# Patient Record
Sex: Male | Born: 1965 | Race: White | Marital: Single | Smoking: Never smoker
Health system: Southern US, Community
[De-identification: ages and names within clinical notes are randomized; demographics above are authoritative.]

## PROBLEM LIST (undated history)

## (undated) DIAGNOSIS — I429 Cardiomyopathy, unspecified: Secondary | ICD-10-CM

## (undated) DIAGNOSIS — I209 Angina pectoris, unspecified: Secondary | ICD-10-CM

## (undated) DIAGNOSIS — R0609 Other forms of dyspnea: Secondary | ICD-10-CM

## (undated) DIAGNOSIS — R0600 Dyspnea, unspecified: Secondary | ICD-10-CM

## (undated) DIAGNOSIS — R0602 Shortness of breath: Secondary | ICD-10-CM

## (undated) DIAGNOSIS — E785 Hyperlipidemia, unspecified: Secondary | ICD-10-CM

## (undated) DIAGNOSIS — I10 Essential (primary) hypertension: Secondary | ICD-10-CM

## (undated) HISTORY — DX: Dyspnea, unspecified: R06.00

## (undated) HISTORY — PX: CARDIAC CATHETERIZATION: SHX172

---

## 2008-09-14 HISTORY — PX: BACK SURGERY: SHX140

## 2012-04-05 ENCOUNTER — Other Ambulatory Visit: Payer: Self-pay | Admitting: Family Medicine

## 2012-04-05 DIAGNOSIS — M549 Dorsalgia, unspecified: Secondary | ICD-10-CM

## 2012-04-05 DIAGNOSIS — M542 Cervicalgia: Secondary | ICD-10-CM

## 2012-04-11 ENCOUNTER — Ambulatory Visit: Payer: Commercial Managed Care - POS

## 2014-08-13 ENCOUNTER — Ambulatory Visit: Payer: Commercial Managed Care - POS

## 2014-08-13 NOTE — Pre-Procedure Instructions (Signed)
   Labs Done on 08/08/2014 @ [employer] in [place] [phone]   NPO reinforced and Directions reviewed

## 2014-08-14 ENCOUNTER — Inpatient Hospital Stay: Payer: Commercial Managed Care - POS

## 2014-08-14 ENCOUNTER — Encounter: Admission: RE | Disposition: A | Discharge status: Home Health | Payer: Self-pay | Source: Ambulatory Visit

## 2014-08-14 ENCOUNTER — Ambulatory Visit: Payer: Commercial Managed Care - POS

## 2014-08-14 ENCOUNTER — Inpatient Hospital Stay: Payer: Commercial Managed Care - POS | Admitting: Cardiovascular Disease

## 2014-08-14 ENCOUNTER — Inpatient Hospital Stay
Admission: RE | Admit: 2014-08-14 | Discharge: 2014-08-20 | DRG: 234 | Disposition: A | Discharge status: Home Health | Payer: Commercial Managed Care - POS | Source: Ambulatory Visit

## 2014-08-14 DIAGNOSIS — I10 Essential (primary) hypertension: Secondary | ICD-10-CM | POA: Diagnosis present

## 2014-08-14 DIAGNOSIS — E669 Obesity, unspecified: Secondary | ICD-10-CM | POA: Diagnosis present

## 2014-08-14 DIAGNOSIS — I25119 Atherosclerotic heart disease of native coronary artery with unspecified angina pectoris: Secondary | ICD-10-CM | POA: Diagnosis present

## 2014-08-14 DIAGNOSIS — I25110 Atherosclerotic heart disease of native coronary artery with unstable angina pectoris: Principal | ICD-10-CM | POA: Diagnosis present

## 2014-08-14 DIAGNOSIS — Z006 Encounter for examination for normal comparison and control in clinical research program: Secondary | ICD-10-CM

## 2014-08-14 DIAGNOSIS — E785 Hyperlipidemia, unspecified: Secondary | ICD-10-CM | POA: Diagnosis present

## 2014-08-14 DIAGNOSIS — I25709 Atherosclerosis of coronary artery bypass graft(s), unspecified, with unspecified angina pectoris: Secondary | ICD-10-CM

## 2014-08-14 DIAGNOSIS — Z6829 Body mass index (BMI) 29.0-29.9, adult: Secondary | ICD-10-CM

## 2014-08-14 HISTORY — DX: Angina pectoris, unspecified: I20.9

## 2014-08-14 HISTORY — DX: Other forms of dyspnea: R06.09

## 2014-08-14 HISTORY — DX: Shortness of breath: R06.02

## 2014-08-14 HISTORY — DX: Essential (primary) hypertension: I10

## 2014-08-14 HISTORY — DX: Cardiomyopathy, unspecified: I42.9

## 2014-08-14 HISTORY — DX: Hyperlipidemia, unspecified: E78.5

## 2014-08-14 SURGERY — LEFT HEART CATH POSS PCI
Anesthesia: Conscious Sedation | Laterality: Left

## 2014-08-14 MED ORDER — ASPIRIN 81 MG PO TBEC
81.0000 mg | DELAYED_RELEASE_TABLET | Freq: Every day | ORAL | Status: DC
Start: 2014-08-14 — End: 2014-08-16
  Administered 2014-08-14 – 2014-08-15 (×2): 81 mg via ORAL
  Filled 2014-08-14 (×2): qty 1

## 2014-08-14 MED ORDER — METOPROLOL TARTRATE 25 MG PO TABS
25.0000 mg | ORAL_TABLET | Freq: Two times a day (BID) | ORAL | Status: DC
Start: 2014-08-14 — End: 2014-08-16
  Administered 2014-08-14 – 2014-08-15 (×3): 25 mg via ORAL
  Filled 2014-08-14 (×3): qty 1

## 2014-08-14 MED ORDER — ATORVASTATIN CALCIUM 40 MG PO TABS
40.0000 mg | ORAL_TABLET | Freq: Every evening | ORAL | Status: DC
Start: 2014-08-14 — End: 2014-08-15
  Administered 2014-08-14: 40 mg via ORAL
  Filled 2014-08-14: qty 1

## 2014-08-14 MED ORDER — IODIXANOL 320 MG/ML IV SOLN
110.0000 mL | Freq: Once | INTRAVENOUS | Status: AC | PRN
Start: 2014-08-14 — End: 2014-08-14
  Administered 2014-08-14: 110 mL via INTRA_ARTERIAL

## 2014-08-14 MED ORDER — SODIUM CHLORIDE 0.9 % IV SOLN
INTRAVENOUS | Status: DC
Start: 2014-08-14 — End: 2014-08-14

## 2014-08-14 MED ORDER — MIDAZOLAM HCL 2 MG/2ML IJ SOLN
INTRAMUSCULAR | Status: AC
Start: 2014-08-14 — End: 2014-08-14
  Administered 2014-08-14: 2 mg via INTRAVENOUS
  Filled 2014-08-14: qty 2

## 2014-08-14 MED ORDER — NITROGLYCERIN 2 % TD OINT
0.5000 [in_us] | TOPICAL_OINTMENT | TRANSDERMAL | Status: DC
Start: 2014-08-14 — End: 2014-08-16
  Administered 2014-08-14 – 2014-08-15 (×4): 0.5 [in_us] via TOPICAL
  Filled 2014-08-14 (×4): qty 1

## 2014-08-14 MED ORDER — VERAPAMIL HCL 2.5 MG/ML IV SOLN
INTRAVENOUS | Status: AC
Start: 2014-08-14 — End: 2014-08-14
  Administered 2014-08-14: 2.5 mg via INTRA_ARTERIAL
  Filled 2014-08-14: qty 2

## 2014-08-14 MED ORDER — HEPARIN SODIUM (PORCINE) 1000 UNIT/ML IJ SOLN
INTRAMUSCULAR | Status: AC
Start: 2014-08-14 — End: 2014-08-14
  Administered 2014-08-14: 4000 [IU] via INTRAVENOUS
  Filled 2014-08-14: qty 10

## 2014-08-14 MED ORDER — FENTANYL CITRATE 0.05 MG/ML IJ SOLN
INTRAMUSCULAR | Status: AC
Start: 2014-08-14 — End: 2014-08-14
  Administered 2014-08-14: 50 ug via INTRAVENOUS
  Filled 2014-08-14: qty 2

## 2014-08-14 MED ORDER — ACETAMINOPHEN 325 MG PO TABS
650.0000 mg | ORAL_TABLET | ORAL | Status: DC | PRN
Start: 2014-08-14 — End: 2014-08-16

## 2014-08-14 MED ORDER — SODIUM CHLORIDE 0.9 % IV SOLN
INTRAVENOUS | Status: AC
Start: 2014-08-14 — End: 2014-08-15
  Administered 2014-08-14: 50 mL/h via INTRAVENOUS

## 2014-08-14 MED ORDER — FUROSEMIDE 10 MG/ML IJ SOLN
INTRAMUSCULAR | Status: AC
Start: 2014-08-14 — End: 2014-08-14
  Administered 2014-08-14: 40 mg via INTRAVENOUS
  Filled 2014-08-14: qty 4

## 2014-08-14 MED ORDER — LIDOCAINE HCL (PF) 1 % IJ SOLN
INTRAMUSCULAR | Status: AC
Start: 2014-08-14 — End: 2014-08-14
  Administered 2014-08-14: 2 mL
  Filled 2014-08-14: qty 30

## 2014-08-14 MED ORDER — HEPARIN WASH BOWL 5 UNITS/ML SOLN (CATH LAB)
Status: AC
Start: 2014-08-14 — End: 2014-08-14
  Filled 2014-08-14: qty 2000

## 2014-08-14 NOTE — Progress Notes (Signed)
R radial vasc band deflation completed. R radial vasc band removed. Gauze/tegaderm applied to R radial puncture site. Dressing=clean/dry/intact, soft, no oozing or hematoma, palpable R radial pulse, pt denies CP or SOB, pt denies RUE numbness/coolness/pain/tingling, no s/s of distress.     Pt is A&Ox4, SR on tele, asymptomatic, aldrete=10. Reviewed post-procedure instructions including bleeding precautions w/ pt, pt verb understanding.

## 2014-08-14 NOTE — Progress Notes (Addendum)
ASA      Indication(s):     ( ) Failed stress test  (X) Chest pain / Angina  ( ) Abnormal EKG  ( ) Known CAD  ( ) Heart Failure / post Heart Transplant  ( ) Other:    48 yo M HTN, HLD, progressively worsening CP/SOB concerning for unstable angina with INF wall ischemia and INF wall HK/mildly reduced LVSF on myocardial perfusion imaging, referred for LHC/Cor Angio/Possible PCI.    Review of systems performed:   Yes  (x)         Allergies:     No Known Allergies      Most recent labs:    If available in Epic:     No results found for: WBC, HGB, HCT, MCV, PLT     No results for input(s): NA, K, CL, CO2, BUN, CREAT, EGFR, GLU, CA, ALB, PHOS in the last 168 hours.    If unavailable in Epic, present in paper chart.    ASA Physical Status:     (  )  ASA 1  Healthy patient    (  )  ASA 2  Mild systemic illness    (X)  ASA 3  Systemic disease, though not incapacitating    (  )  ASA 4  Severe systemic disease that is a constant threat to life     (  )  ASA 5  Moribund condition, patient unexpected to live >24 hours, irrespective of    procedure    (  )  E       Emergent procedure    Planned sedation:     (  ) No sedation  (x) Moderate sedation  (  ) Deep sedation (with Anesthesiology present)      Conclusion:     This patient has been seen and examined immediately prior to the procedure, and I feel that they are an appropriate candidate for the planned procedure with the planned sedation.    The procedural details, indications, risks, benefits, and alternatives to the planned procedure and sedation have been explained to the patient or the patient's guardian. All questions answered. Patient desires to proceed.    The currently available history & physical has been reviewed, and there are no major changes.     Exceptions only in the case of emergency.         Burna Forts, MD

## 2014-08-14 NOTE — Progress Notes (Signed)
Pt arrived to [facility] from home. Pt and procedure verified-LHC with possible PCI with dr. Idolina Primer. Assessment completed. No distress noted. Pt denies pain and sob. All needs addressed. NPO status verified. Call bell within reach. Will continue to monitor.     Pre- Cath Teaching and Learning Objectives   Learner: Candie Mile,   Preference for learning: Verbal  Teaching Method: Verbal Instruction   Outcome of Learning: Fully Achieved    Described/Demonstrated the following:     + Responsibilities of patient's care  + Cardiac Cath/PTCA/Stent/Brachytherapy   + Purpose of procedure  + Need to be NPO pre-procedure  + Need for maintaining bedrest & straight leg post-procedure & sheath removal.  + Necessary fluid intake after procedure  + Symptoms of bleeding & states plan to notify nurse.

## 2014-08-14 NOTE — Progress Notes (Signed)
Report was obtained for the [facility] nurse, pt came to the unit at about 1835 via a stretcher escorted by the nurse. Pt walked to the bed with no difficulty. Orientation about the unit and room is given. Dressing on the right wrist clean, dry, and intact. No hematoma noted. Pt is placed on tele monitor and dinner is provided. Cardiac surgery NP is in the room discussing planned procedure at this time.

## 2014-08-14 NOTE — Progress Notes (Signed)
Pt with vascband set at 10ml. Right radial area clean, dry, and intact.

## 2014-08-14 NOTE — Plan of Care (Signed)
Pt. Being taken to ultra sound. Unable to take vitals. Pt. Being escorted with nurse.

## 2014-08-14 NOTE — Progress Notes (Signed)
Post-Cardiac Catheterization Note    Patient Info:   Jimmy Cardenas     48 y.o. male   1.676 m (5\' 6" ) 85.004 kg (187 lb 6.4 oz)         48 yo M HTN, HLD, progressively worsening CP/SOB concerning for unstable angina with INF wall ischemia and INF wall HK/mildly reduced LVSF on myocardial perfusion imaging, referred for LHC/Cor Angio/Possible PCI.    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 Multivessel CAD.  2. Left main disease (90% distal LM).  3. Subtotal occlusion of dominant mid-RCA.  4. Mildly reduced LVSF/EF 45-50% with INF>ANT HK.    Findings:     Right radial artery access.    Hemodynamics: LV 158/0/39. AO 141/87/114.    LV Function: EF 45-50%. INF severe HK. ANT mild HK.    Cor Angio:    Right dominant circulation.    Left Main: Distal 90% stenosis.    LAD: Mild diffuse dz.    Circumflex: Non-dominant. Mild diffuse dz.    RCA: Dominant. Mid-vessel 99% subtotal occlusion. Distal vessel ? moderate disease (underfilled due to more proximal disease).    Right radial artery sheath removed and Vasc Band applied with patent hemostasis.      Complications:   None    PLAN:    - CVTSA consult for CABG this admission ([facility] contacted by me).  - Vasc Band off 2 hours.  - Admit to hospital.  - ASA, Statin, B-Blocker, NTG paste.  - Echo, Carotid Duplex.  - Plan discussed with patient.    Signed by: Burna Forts, MD   08/14/2014  2:03 PM

## 2014-08-14 NOTE — Consults (Addendum)
FINAL                    [facility]                            SURGERY CONSULTATION     Patient Name:           Jimmy Cardenas, Jimmy Cardenas Date:             08/14/2014              Age      48  [place] MRN:              [id number]               Gender:  Male  [place] Account #:        [id number]            Race:    Caucasian  Date of Birth:          02-Jan-1966  Scheduled Surgeon:      Darrelle Barrell,M.D.  Consult Performed  By:  Cherre Huger, NP  Hospital:               [facility]  Assessment Location:    Patient  Room     Referring Physician(s):  Truesdell,Alexander,M.D.     History of Present  Illness:  CARDIAC SURGERY  EVALUATION IS REQUESTED  FOR THIS 48  YEAR OLD MALE  WITH PROGRESSIVELY  WORSENING DYSPNEA  ON EXERTION AND  CHEST  DISCOMFORT. MYOCARDIAL  PERFUSION  STUDY DONE BY HIS  PCP SHOWED  INFERIOR WALL ISCHEMIA  AND MILDLY  REDUCED SYSTOLIC  FUNCTION AND HE  WAS SENT FOR CARDIOLOGY  EVALUATION.   CARDIAC CATH  WAS DONE ON  08/14/14 BY DR. Idolina Primer  AND REVEALED  90% DISTAL LEFT  MAIN DISEASE  AND SUBTOTAL MID  RCA OCCLUSION WITH  AND EF OF 45-  50%.  HE IS  REFERRED FOR CABG.        Past Medical History     Hypertension  Hypercholesterolemia  Alcohol Consumption:  <= 1 drink/week  Exercise: None  Smoking History:       Never        Past Surgical        L4-5 laminectomy  2010  History:  Family History:      NO PREMATURE  CAD                       MOTHER WITH HEART  PROBLEMS OLDER  AGE  Social History:      LIVES ALONE.  Radiology and Test   CARDIAC CATH  08/14/14:   LEFT MAIN-  90% DISTAL  Review:              OCCLUSION,  MID  RCA- SUBTOTAL  OCCLUSION,  EF 45-                       50%,                          ECHO 08/14/14:   RESULTS PENDING                       STS RISK SCORE:   MORTALITY = 0.28%,   M/M= 6.13%\  LABS (08/07/14):  K+ 4.6,  BUN  21, CREAT 1.08,                       PLT 281,  HGB  13.3, HCT 40.2,   WBC 8.4         ALLERGY                  DESCRIPTION  ALLERGIES NOT ON  FILE  NO KNOWN ALLERGIES       SYSTEMIC        Home Medications:     MEDICATION     DOSE   UNIT     ROUTE      FREQUENCY    COMMENT  Aspirin        81     mg       PO         Daily  Lipitor        40     mg       PO         Daily  Metoprolol     25     mg       PO         Q 12  HRS  NITRO-BID OINT0.5     2%       TOP        Q 6 HRS                 inch                 Physical Examination     HR:     80   BP            BP     148/8RR:  18   O2     99 % Temp: 98.7               Right:        Left:  9              Sat:  Weight: 187.33   84.96  kgHeight:  65.75  167.01  cm          lbs                in        Review of Systems:     Respiratory  +DOE. Denies  wheezing, cough,  hemoptysis, asthma  or               emphysema.  CV           +CHEST PAIN  WITH EXERTION. Denies  heart murmur,               palpitations,  orthopnea, paroxysmal  nocturnal  dyspnea or               edema.  Abdomen      Denies indigestion,  nausea, vomiting,  abdominal  pain,               dark or bloody  stools, any change  in bowel habits,               jaundice or  hepatitis.  Extremities  Denies muscle  weakness, arthralgias,  arthritis  or gout.  NeurologicalDenies  Hx of  seizures, strokes,  TIA's, migraines,               weakness, syncope  or near syncope.  Physical Exam  General:        Cooperative,  alert  and oriented, well-developed,                  well-nourished,  in  no acute distress.  Skin:           Warm  and dry, no rashes  or lesions.  Heart:          S1  S2, Regular rhythm,  No murmurs, Rubs  or Gallops.  Lungs:          Lungs  clear to auscultation,  no wheezes,  rales or                  rhonchi.  Neck:           Jugular  venous pressure  less than 5  cm  Abdomen:        Bowel  sounds present  in all four quadrants.  Soft and                  non-tender.  No hepatosplenomegaly.  Extremities:    No  clubbing, cyanosis  or edema noted.     Diagnosis:        CAD, LM  DISEASE  HTN  HLD  HX L4-5 LAMINECTOMY     Treatment Plan:   FOR CABG 08/16/14  WITH DR. Talon Witting  BILATERAL CAROTID  DOPPLER TO RULE  OUT STENOSIS.  BILATERAL LOWER  EXTREMITY DOPPLERS  FOR VEIN MAPPING.  BEDSIDE SPIROMETRY  TESTING IS NEEDED  Patient was educated  on procedure,  risks and benefits.  Risks include  but not limited  to infection, bleeding,  MI, CVA, renal  failure,  death. The patient  was given consent  information handouts,  including  FAQs about surgical  site infections.  All questions  were answered.  The patient agrees  to proceed with  surgery.  MRSA SCREENING PER  PROTOCOL.  PRE OPERATIVE AMIODARONE  PRESCRIBED.  PRE OPERATIVE BACTROBAN  PRESCRIBED.  WILL DECREASE STATINS  PER PROTOCOL.              Suezanne Jacquet Loman,  NP   electronically  signed on 08/14/2014  7:15:15 PM  with status of Approved     I have reviewed  the H and P and examined  the patient  and any changes  to the patient from  the time of the  original H and  P are included in  this document.     Windell Moment, M.D.     electronically signed  on 08/16/2014  1:53:45 PM with  status of Final

## 2014-08-14 NOTE — Progress Notes (Signed)
Bedside Spirometry is done. The result was faxed to Medical Record and the hard copy is in the chart.     Rolla Flatten RRT       08/14/14 2200   Spirometry Pre and/or Post   Procedure Location and Type Bedside- Pre Only   Pre FVC 3.55 L   Pre FEV1 3.03 L   Pre FEV1/FVC % (Calculated) 85   Patient follows commands Yes   Cough during exhalation No   Early termination No   Leak No   Variable effort Yes   2 FVC 150 ml of each other Yes   2 FEV1 150 ml of each other Yes   3 maneuvers performed Yes   Patient Effort Good   Adverse Reactions None   Primary Charges   $ Spirometry Type Performed Simple - CPT 94010

## 2014-08-14 NOTE — Plan of Care (Signed)
Pt s/p heart cath today. Right radial dressing CDI, no oozing, no hematoma. VS wnl, tele shows SR. Pt denies any chest pain or SOB. For CABG on thur.

## 2014-08-14 NOTE — Progress Notes (Signed)
CV surgery consult requested for Jimmy Cardenas, a 48 year old male with exertional chest pain and + nuclear stress test who came into hospital today for cardiac cath and found to have 90% distal LM disease and occluded RCA. He is being referred for CABG. Full consult to follow.  Films reviewed by Dr. Koleen Nimrod.  Will plan for CABG 08/16/14 with Dr. Ad.  Cardiac surgery orders entered into EPIC .    Jimmy Herter, NP  Cardiac surgery [facility]  Spect [phone]

## 2014-08-14 NOTE — Progress Notes (Signed)
Pt transported safely via stretcher on monitor w/ RN escort form ultrasound to [id number]. R radial vasc band deflation completed. R radial vasc band removed. Gauze/tegaderm applied to R radial puncture site. Dressing=clean/dry/intact, soft, no oozing or hematoma, palpable R radial pulse, pt denies CP or SOB, pt denies RUE numbness/coolness/pain/tingling, no s/s of distress.

## 2014-08-15 LAB — ECG 12-LEAD
Atrial Rate: 76 {beats}/min
P Axis: 33 degrees
P-R Interval: 144 ms
Q-T Interval: 380 ms
QRS Duration: 84 ms
QTC Calculation (Bezet): 427 ms
R Axis: 24 degrees
T Axis: 5 degrees
Ventricular Rate: 76 {beats}/min

## 2014-08-15 LAB — URINALYSIS WITH MICROSCOPIC
Bilirubin, UA: NEGATIVE
Glucose, UA: NEGATIVE
Ketones UA: NEGATIVE
Leukocyte Esterase, UA: NEGATIVE
Nitrite, UA: NEGATIVE
Specific Gravity UA: 1.034 (ref 1.001–1.035)
Urine pH: 6 (ref 5.0–8.0)
Urobilinogen, UA: NORMAL mg/dL

## 2014-08-15 LAB — CBC
Hematocrit: 45.1 % (ref 42.0–52.0)
Hgb: 14.9 g/dL (ref 13.0–17.0)
MCH: 29.3 pg (ref 28.0–32.0)
MCHC: 33 g/dL (ref 32.0–36.0)
MCV: 88.8 fL (ref 80.0–100.0)
MPV: 10.3 fL (ref 9.4–12.3)
Nucleated RBC: 0 /100 WBC (ref 0–1)
Platelets: 283 10*3/uL (ref 140–400)
RBC: 5.08 10*6/uL (ref 4.70–6.00)
RDW: 13 % (ref 12–15)
WBC: 8.92 10*3/uL (ref 3.50–10.80)

## 2014-08-15 LAB — COMPREHENSIVE METABOLIC PANEL
ALT: 23 U/L (ref 0–55)
AST (SGOT): 16 U/L (ref 5–34)
Albumin/Globulin Ratio: 1 (ref 0.9–2.2)
Albumin: 3.8 g/dL (ref 3.5–5.0)
Alkaline Phosphatase: 81 U/L (ref 38–106)
BUN: 19 mg/dL (ref 9.0–28.0)
Bilirubin, Total: 0.4 mg/dL (ref 0.2–1.2)
CO2: 24 mEq/L (ref 22–29)
Calcium: 9.3 mg/dL (ref 8.5–10.5)
Chloride: 104 mEq/L (ref 100–111)
Creatinine: 1 mg/dL (ref 0.7–1.3)
Globulin: 3.8 g/dL — ABNORMAL HIGH (ref 2.0–3.6)
Glucose: 102 mg/dL — ABNORMAL HIGH (ref 70–100)
Potassium: 3.7 mEq/L (ref 3.5–5.1)
Protein, Total: 7.6 g/dL (ref 6.0–8.3)
Sodium: 137 mEq/L (ref 136–145)

## 2014-08-15 LAB — TYPE AND SCREEN
AB Screen Gel: NEGATIVE
ABO Rh: A POS

## 2014-08-15 LAB — PT AND APTT
PT INR: 1 (ref 0.9–1.1)
PT: 13.1 s (ref 12.6–15.0)
PTT: 33 s (ref 23–37)

## 2014-08-15 LAB — GFR: EGFR: >60

## 2014-08-15 LAB — HEMOGLOBIN A1C: Hemoglobin A1C: 5.3 % (ref 0.0–6.0)

## 2014-08-15 MED ORDER — ATORVASTATIN CALCIUM 80 MG PO TABS
80.0000 mg | ORAL_TABLET | Freq: Every evening | ORAL | Status: DC
Start: 2014-08-15 — End: 2014-08-16
  Administered 2014-08-15: 80 mg via ORAL
  Filled 2014-08-15: qty 1

## 2014-08-15 MED ORDER — CEFUROXIME SODIUM 1.5 G IJ/IV SOLR (WRAP)
1.5000 g | Status: DC
Start: 2014-08-16 — End: 2014-08-16

## 2014-08-15 MED ORDER — MUPIROCIN 2% NASAL OINTMENT
TOPICAL_OINTMENT | Freq: Two times a day (BID) | CUTANEOUS | Status: AC
Start: 2014-08-15 — End: 2014-08-16
  Administered 2014-08-15 – 2014-08-16 (×2): 1 via NASAL
  Filled 2014-08-15 (×2): qty 1

## 2014-08-15 MED ORDER — AMIODARONE HCL 200 MG PO TABS
400.0000 mg | ORAL_TABLET | Freq: Three times a day (TID) | ORAL | Status: DC
Start: 2014-08-15 — End: 2014-08-16
  Administered 2014-08-15 – 2014-08-16 (×4): 400 mg via ORAL
  Filled 2014-08-15 (×4): qty 2

## 2014-08-15 NOTE — Progress Notes (Signed)
VSS, patient is anticipating CABG tomorrow, type and screen and other required labs drawn and done.  He had a shower this morning but will need a CHG bath this evening and tomorrow morning. NPO at midnight and beyond. Patient will be picked up around 5:30 am in morning for his surgery.

## 2014-08-15 NOTE — Progress Notes (Addendum)
HEART PROGRESS NOTE  [facility]      Date Time: 08/15/2014 8:29 AM  Patient Name: [facility]  Medical Record #:  [id number]  Account#:  [id number]  Admission Date:  08/14/2014         Patient Active Problem List   Diagnosis   . 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    Assessment:    S/p LHC 12/1 showing multivessel CAD (Left main 90%, RCA 99%) and EF 45-50%   Inferior wall ischemia on perfusion imaging   HLD   HTN    Recommendations:    Undergoing workup for CABG, tentatively planned for 12/3   Continue asa, statin, BB, nitro-bid    ATTENDING ADDENDUM  Patient seen and examined.  No cp, R radial site intact, likely cabg in am.  On Nitrobid comorbid HTN/LV dysfunction, eventual ACE likely post CABG, held preop.    Johnell Comings MD [facility]    Medications:      Scheduled Meds:      amiodarone 400 mg Oral Q8H SCH   aspirin EC 81 mg Oral Daily   atorvastatin 40 mg Oral QHS   [START ON 08/16/2014] cefuroxime 1.5 g Intravenous 60 Min Pre-Op   metoprolol 25 mg Oral Q12H SCH   mupirocin  Nasal Q12H SCH   nitroglycerin 0.5 inch Topical Q6H HOLD MN       Continuous Infusions:            Physical Exam:       VITAL SIGNS PHYSICAL EXAM   Filed Vitals:    08/15/14 0827   BP: 136/79   Pulse: 89   Temp: 96.1 F (35.6 C)   Resp: 16   SpO2: 97%     Temp (24hrs), Avg:97.4 F (36.3 C), Min:96.1 F (35.6 C), Max:98.7 F (37.1 C)      Telemetry: Reviewed no changes    No intake or output data in the 24 hours ending 08/15/14 0829 Physical Exam  General: awake, alert, breathing comfortably, no acute distress  Head: normocephalic  Eyes: EOM's intact  Cardiovascular: regular rate and rhythm, normal S1, S2, no S3, no S4, no murmurs, rubs or gallops  Neck: no carotid bruits or JVD  Lungs: clear to auscultation bilateraly, without wheezing,  rhonchi, or rales  Abdomen: soft, non-tender, non-distended; no palpable masses,  normoactive bowel sounds  Extremities: no edema  Pulse: equal pulses, 4/4 symmetric  Neurological: Alert and oriented X3, mood and affect normal  Musculoskeletal: normal strength and tone         Labs:   No results for input(s): CK, TROPI, TROPT, CKMBINDEX in the last 168 hours.  No results for input(s): DIG in the last 168 hours.  No results for input(s): CHOL, TRIG, HDL, LDL in the last 168 hours.    Recent Labs  Lab 08/15/14  0104   BILIRUBIN, TOTAL 0.4   PROTEIN, TOTAL 7.6   ALBUMIN 3.8   ALT 23   AST (SGOT) 16     No results for input(s): MG in the last 168 hours.    Recent Labs  Lab 08/15/14  0104   PT 13.1   PT INR 1.0   PTT 33       Recent Labs  Lab 08/15/14  0104   WBC 8.92  HEMOGLOBIN 14.9   HEMATOCRIT 45.1   PLATELETS 283       Recent Labs  Lab 08/15/14  0104   SODIUM 137   POTASSIUM 3.7   CHLORIDE 104   CO2 24   BUN 19.0   CREATININE 1.0   EGFR >60.0   GLUCOSE 102*   CALCIUM 9.3     No results for input(s): TSH, FREET3 in the last 168 hours.    Invalid input(s): FREET4    .No results found for: BNP     Imaging:   Radiological Procedure reviewed.        Signed by: Dreama Saa, [place]      [facility]  NP Spectralink [phone] (8am-5pm)  MD Spectralink [phone] or [phone] (8am-5pm)  Arrhythmia Spectralink [phone] (8am-4:30pm)  After hours, non urgent consult line [phone]  After Hours, urgent consults [phone]

## 2014-08-15 NOTE — Progress Notes (Signed)
CV surgery NP - pt for CABG tomorrow - case has been moved to first case - RN advised and will tell patient. Pre-op orders placed.

## 2014-08-15 NOTE — Progress Notes (Signed)
Case Management Initial Discharge Planning Assessment    [place] Dispo: Home no needs  Anticipated [place] Date: TBD-pending CABG         Patient is a Medicare focused diagnosis patient? Yes/No NO   Patient is a 30 day INPATIENT readmission (current inpatient admission and another inpatient admission within 30 days)? Yes/No NO      EPIC Documentation  CM Assessment complete button selected in CM Navigator? Yes/No Yes   Readmissions flowsheet completed in CM Navigator if patient is readmission? Yes/No Not yet       Psychosocial/Demographic Information   Name of interviewee/s:  Jimmy Cardenas   Orientation and decision making abilities of patient (ie a&ox3 able to make decisions, demented patient, patient on vent, etc)    Alert & Oriented X3   Does the patient have an Advance Directive? Location? (home/on chart, if home-advised to bring in copy?) <no information>  Advance Directive: Patient has advance directive, copy not in chart]   Healthcare Decision Maker (HDM) (if other than the patient) Include relationship and contact information.   His sister Jimmy Cardenas is an absolute [phone]   Any additional emergency contacts? Extended Emergency Contact Information  Primary Emergency Contact: Contact,No   [place]  Home Phone: [phone]  Relation: None  Secondary Emergency Contact: Jimmy Cardenas [place]  Home Phone: [phone]  Work Phone: [phone]  Relation: Other   Pt lives with:  Living Arrangements: Family members]   Type of residence where patient lives:    ]   ]By himself in a [facility] in a basement    Prior level of functioning (ambulation & ADL's)   ]Very independent   Support system-list  (i.e.church, friends, extended family, friends?)  Family   Do you want to designate an individual who will care for or assist you upon discharge? Sister Jimmy Cardenas   If yes: Please list the name, relationship, phone number, and address of the designated individual.  Name:  Relationship:  Phone Number:  Address:    **Please find info above   Correct Insurance listed on face sheet - verified with the patient/HDM  Yes--[employer]      Discharge Planning Services in Place  Name of Primary Care Physician verified in patient banner (update in patient banner if not listed) Jimmy Sale, DO  [phone]   What DME does the patient currently own? (rolling walker, hospital bed, home O2, BiPAP/CPAP, bedside commode, cane, hoyer lift)  ]   ]None   ]   Are PT/OT services indicated? If so, has it been ordered?   No   Has the patient been to an Acute Rehab or SNF in the past?  If so, where?    No   Does the patient currently have home health or hospice/palliative services in place?  If so, list agency name.    No   Does the patient already have community dialysis set up?  If so, where?  No      Readmission Assessment  What is the current LACE score?  1   Is this patient an inpatient to inpatient 30 day readmission?  No   Previous admission discharge diagnosis  N/A   Was patient readmitted from a facility?  N/A      Patient active with Home Health?  N/A      Patient active with Home Hospice?    N/A   Contributing factors to readmission (i.e., no follow up appt on previous d/c, unable to  get meds, no insurance, no social support, etc.)    Did patient/family understand what medication was for and how to administer, symptoms to indicate worsening condition, activity and diet restrictions at time of previous d/c?        N/A      Anticipated Discharge Plan  Anticipated Disposition: Option A  Home no needs   Anticipated Disposition: Option B  Family support   Who will transport the patient upon discharge?  Jimmy Cardenas   If applicable, were SNF or Hospice choices provided?  N/A   Palliative Care Consult needed? (if yes, contact attending MD)  N/A      Medicare/Medicare HMO Patients Only  If this patient is inpatient, was an initial IMM signed within 24 hours of admission? (Look in media tab,  documents table or shadow chart)    N/A   Is this patient identified as a Medicare focused diagnosis or readmission? Yes/No NO        Shirlean Mylar, MSW  Case Management  CTUS  [facility]  [phone]

## 2014-08-15 NOTE — Research Enrollment (Signed)
Research Enrollment Note:   Research Study: The Use of [place] Cardioplegia in Adult Cardiac Surgery   Subject was identified by Dr. Ad and meets all inclusion and no exclusion criteria for the above study.   The above study was discussed by Ronnell Freshwater RN with patient on 08/15/2014 for approximately 30 minutes, in a private setting, room 363.   Subject was provided an opportunity to ask questions, and given adequate time to read through the informed consent document. All questions were answered by Ronnell Freshwater RN. Subject verbalizes understanding of study randomization, risks and potential benefits, and the voluntary nature of participation. Subject agrees to participate in study, and subject signed informed consent document.   No research procedures performed prior to obtaining informed consent. Copy of signed consent given to subject, copy of consent scanned and placed in electronic chart in EPIC under media tab, and original consent form resides in subject research file. Site study contact information was given to subject.   For questions regarding the study, please contact PI Dr. Alvira Philips Ad (pager # [phone]) or Ronnell Freshwater RN, [facility] (pager [phone]).

## 2014-08-16 ENCOUNTER — Encounter
Admission: RE | Disposition: A | Discharge status: Home Health | Payer: Commercial Managed Care - POS | Source: Ambulatory Visit

## 2014-08-16 ENCOUNTER — Other Ambulatory Visit: Payer: Commercial Managed Care - POS

## 2014-08-16 ENCOUNTER — Inpatient Hospital Stay: Payer: Commercial Managed Care - POS

## 2014-08-16 DIAGNOSIS — I25110 Atherosclerotic heart disease of native coronary artery with unstable angina pectoris: Principal | ICD-10-CM

## 2014-08-16 DIAGNOSIS — I10 Essential (primary) hypertension: Secondary | ICD-10-CM

## 2014-08-16 DIAGNOSIS — E785 Hyperlipidemia, unspecified: Secondary | ICD-10-CM

## 2014-08-16 DIAGNOSIS — Z006 Encounter for examination for normal comparison and control in clinical research program: Secondary | ICD-10-CM

## 2014-08-16 DIAGNOSIS — E669 Obesity, unspecified: Secondary | ICD-10-CM

## 2014-08-16 DIAGNOSIS — Z6829 Body mass index (BMI) 29.0-29.9, adult: Secondary | ICD-10-CM

## 2014-08-16 HISTORY — PX: CORONARY ARTERY BYPASS GRAFT: SHX141

## 2014-08-16 HISTORY — PX: TEE: SHX5571

## 2014-08-16 HISTORY — PX: ENDOSCOPIC,VEIN HARVEST: SHX3864

## 2014-08-16 HISTORY — PX: CORONARY ARTERY BYPASS: SHX3487

## 2014-08-16 LAB — ABG WITH NA/K/CA IONIZED
Arterial Total CO2: 24.7 mEq/L (ref 24.0–30.0)
Arterial Total CO2: 23.5 mEq/L — ABNORMAL LOW (ref 24.0–30.0)
Arterial Total CO2: 23.8 mEq/L — ABNORMAL LOW (ref 24.0–30.0)
Arterial Total CO2: 23.8 mEq/L — ABNORMAL LOW (ref 24.0–30.0)
Arterial Total CO2: 24.6 mEq/L (ref 24.0–30.0)
Arterial Total CO2: 22 mEq/L — ABNORMAL LOW (ref 24.0–30.0)
Base Excess, Arterial: -0.3 mEq/L (ref <=2.0)
Base Excess, Arterial: -2.4 mEq/L — ABNORMAL LOW (ref <=2.0)
Base Excess, Arterial: -1.9 mEq/L (ref <=2.0)
Base Excess, Arterial: -0.9 mEq/L (ref <=2.0)
Base Excess, Arterial: -1.5 mEq/L (ref <=2.0)
Base Excess, Arterial: -3 mEq/L — ABNORMAL LOW (ref <=2.0)
Calcium, Ionized: 2.39 mEq/L (ref 2.30–2.58)
Calcium, Ionized: 2.32 mEq/L (ref 2.30–2.58)
Calcium, Ionized: 2.05 mEq/L — ABNORMAL LOW (ref 2.30–2.58)
Calcium, Ionized: 2.07 mEq/L — ABNORMAL LOW (ref 2.30–2.58)
Calcium, Ionized: 2.12 mEq/L — ABNORMAL LOW (ref 2.30–2.58)
Calcium, Ionized: 2.41 mEq/L (ref 2.30–2.58)
HCO3, Arterial: 23.5 mEq/L (ref 23.0–29.0)
HCO3, Arterial: 22.3 mEq/L — ABNORMAL LOW (ref 23.0–29.0)
HCO3, Arterial: 22.6 mEq/L — ABNORMAL LOW (ref 23.0–29.0)
HCO3, Arterial: 22.6 mEq/L — ABNORMAL LOW (ref 23.0–29.0)
HCO3, Arterial: 23.4 mEq/L (ref 23.0–29.0)
HCO3, Arterial: 20.9 mEq/L — ABNORMAL LOW (ref 23.0–29.0)
O2 Sat, Arterial: >100 % (ref 95.0–100.0)
O2 Sat, Arterial: 99.9 % (ref 95.0–100.0)
O2 Sat, Arterial: >100 % (ref 95.0–100.0)
O2 Sat, Arterial: >100 % (ref 95.0–100.0)
O2 Sat, Arterial: >100 % (ref 95.0–100.0)
O2 Sat, Arterial: 99.9 % (ref 95.0–100.0)
Temperature: 37
Temperature: 34.3
Temperature: 37
Temperature: 37
Temperature: 37
Temperature: 35.3
Whole Blood Potassium: 3.6 mEq/L (ref 3.5–5.3)
Whole Blood Potassium: 4.1 mEq/L (ref 3.5–5.3)
Whole Blood Potassium: 4.4 mEq/L (ref 3.5–5.3)
Whole Blood Potassium: 4.3 mEq/L (ref 3.5–5.3)
Whole Blood Potassium: 4.4 mEq/L (ref 3.5–5.3)
Whole Blood Potassium: 3.7 mEq/L (ref 3.5–5.3)
Whole Blood Sodium: 139 mEq/L (ref 136–146)
Whole Blood Sodium: 139 mEq/L (ref 136–146)
Whole Blood Sodium: 135 mEq/L — ABNORMAL LOW (ref 136–146)
Whole Blood Sodium: 135 mEq/L — ABNORMAL LOW (ref 136–146)
Whole Blood Sodium: 136 mEq/L (ref 136–146)
Whole Blood Sodium: 138 mEq/L (ref 136–146)
pCO2, Arterial: 37.7 mmhg (ref 35.0–45.0)
pCO2, Arterial: 35.6 mmhg (ref 35.0–45.0)
pCO2, Arterial: 40.1 mmhg (ref 35.0–45.0)
pCO2, Arterial: 38 mmhg (ref 35.0–45.0)
pCO2, Arterial: 39.6 mmhg (ref 35.0–45.0)
pCO2, Arterial: 32.2 mmhg — ABNORMAL LOW (ref 35.0–45.0)
pH, Arterial: 7.412 (ref 7.350–7.450)
pH, Arterial: 7.397 (ref 7.350–7.450)
pH, Arterial: 7.369 (ref 7.350–7.450)
pH, Arterial: 7.389 (ref 7.350–7.450)
pH, Arterial: 7.392 (ref 7.350–7.450)
pH, Arterial: 7.418 (ref 7.350–7.450)
pO2, Arterial: 375 mmhg — ABNORMAL HIGH (ref 80.0–90.0)
pO2, Arterial: 449 mmhg — ABNORMAL HIGH (ref 80.0–90.0)
pO2, Arterial: 273 mmhg — ABNORMAL HIGH (ref 80.0–90.0)
pO2, Arterial: 225 mmhg — ABNORMAL HIGH (ref 80.0–90.0)
pO2, Arterial: 261 mmhg — ABNORMAL HIGH (ref 80.0–90.0)
pO2, Arterial: 202 mmhg — ABNORMAL HIGH (ref 80.0–90.0)

## 2014-08-16 LAB — COOXIMETRY PROFILE
Carboxyhemoglobin: 1.6 % (ref 0.0–3.0)
Carboxyhemoglobin: 1.3 % (ref 0.0–3.0)
Carboxyhemoglobin: 1.8 % (ref 0.0–3.0)
Hematocrit Total Calculated: 24.5 % — ABNORMAL LOW (ref 40.0–54.0)
Hematocrit Total Calculated: 25.4 % — ABNORMAL LOW (ref 40.0–54.0)
Hematocrit Total Calculated: 26.1 % — ABNORMAL LOW (ref 40.0–54.0)
Hemoglobin Total: 7.9 g/dL — ABNORMAL LOW (ref 13.0–17.0)
Hemoglobin Total: 8.2 g/dL — ABNORMAL LOW (ref 13.0–17.0)
Hemoglobin Total: 8.4 g/dL — ABNORMAL LOW (ref 13.0–17.0)
Methemoglobin: 0.9 % (ref 0.0–3.0)
Methemoglobin: 0.6 % (ref 0.0–3.0)
Methemoglobin: 1.2 % (ref 0.0–3.0)
O2 Content: 8.9
O2 Content: 8.2
O2 Content: 8.9
Oxygenated Hemoglobin: 80.4 % — ABNORMAL LOW (ref 85.0–98.0)
Oxygenated Hemoglobin: 68.8 % — ABNORMAL LOW (ref 85.0–98.0)
Oxygenated Hemoglobin: 77.7 % — ABNORMAL LOW (ref 85.0–98.0)

## 2014-08-16 LAB — CBC
Hematocrit: 35.3 % — ABNORMAL LOW (ref 42.0–52.0)
Hgb: 11.7 g/dL — ABNORMAL LOW (ref 13.0–17.0)
MCH: 29.3 pg (ref 28.0–32.0)
MCHC: 33.1 g/dL (ref 32.0–36.0)
MCV: 88.3 fL (ref 80.0–100.0)
MPV: 9.9 fL (ref 9.4–12.3)
Nucleated RBC: 0 /100 WBC (ref 0–1)
Platelets: 156 10*3/uL (ref 140–400)
RBC: 4 10*6/uL — ABNORMAL LOW (ref 4.70–6.00)
RDW: 13 % (ref 12–15)
WBC: 11.67 10*3/uL — ABNORMAL HIGH (ref 3.50–10.80)

## 2014-08-16 LAB — MAGNESIUM
Magnesium: 3 mg/dL — ABNORMAL HIGH (ref 1.6–2.6)
Magnesium: 2.7 mg/dL — ABNORMAL HIGH (ref 1.6–2.6)

## 2014-08-16 LAB — GLUCOSE WHOLE BLOOD - POCT
Whole Blood Glucose POCT: 127 mg/dL — ABNORMAL HIGH (ref 70–100)
Whole Blood Glucose POCT: 116 mg/dL — ABNORMAL HIGH (ref 70–100)
Whole Blood Glucose POCT: 128 mg/dL — ABNORMAL HIGH (ref 70–100)
Whole Blood Glucose POCT: 137 mg/dL — ABNORMAL HIGH (ref 70–100)
Whole Blood Glucose POCT: 118 mg/dL — ABNORMAL HIGH (ref 70–100)
Whole Blood Glucose POCT: 106 mg/dL — ABNORMAL HIGH (ref 70–100)
Whole Blood Glucose POCT: 114 mg/dL — ABNORMAL HIGH (ref 70–100)
Whole Blood Glucose POCT: 109 mg/dL — ABNORMAL HIGH (ref 70–100)
Whole Blood Glucose POCT: 112 mg/dL — ABNORMAL HIGH (ref 70–100)

## 2014-08-16 LAB — BLOOD GAS, ARTERIAL
Arterial Total CO2: 24.5 mEq/L (ref 24.0–30.0)
Arterial Total CO2: 23.5 mEq/L — ABNORMAL LOW (ref 24.0–30.0)
Base Excess, Arterial: -0.9 mEq/L (ref <=2.0)
Base Excess, Arterial: -1.5 mEq/L (ref <=2.0)
FIO2: 100 %
FIO2: 40 %
HCO3, Arterial: 23.3 mEq/L (ref 23.0–29.0)
HCO3, Arterial: 22.4 mEq/L — ABNORMAL LOW (ref 23.0–29.0)
O2 Sat, Arterial: 100 % (ref 95.0–100.0)
O2 Sat, Arterial: 100 % (ref 95.0–100.0)
PEEP: 5
Rate: 10
Temperature: 34.6
Temperature: 35.8
Tidal vol.: 600
pCO2, Arterial: 34.9 mmhg — ABNORMAL LOW (ref 35.0–45.0)
pCO2, Arterial: 34.7 mmhg — ABNORMAL LOW (ref 35.0–45.0)
pH, Arterial: 7.427 (ref 7.350–7.450)
pH, Arterial: 7.419 (ref 7.350–7.450)
pO2, Arterial: 291 mmhg — ABNORMAL HIGH (ref 80.0–90.0)
pO2, Arterial: 182 mmhg — ABNORMAL HIGH (ref 80.0–90.0)

## 2014-08-16 LAB — BLOOD GAS, VENOUS
Base Excess, Ven: -1.8 mEq/L
Base Excess, Ven: -0.8 mEq/L
Base Excess, Ven: -1.4 mEq/L
HCO3, Ven: 23.2 mEq/L
HCO3, Ven: 23.5 mEq/L
HCO3, Ven: 24.3 mEq/L
O2 Sat, Venous: 82.5 %
O2 Sat, Venous: 70.1 %
O2 Sat, Venous: 80.1 %
Temperature: 37
Temperature: 37
Temperature: 37
Venous Total CO2: 24.6 mEq/L
Venous Total CO2: 24.9 mEq/L
Venous Total CO2: 25.7 mEq/L
pCO2, Venous: 44.2 mmhg
pCO2, Venous: 43.4 mmhg
pCO2, Venous: 45.3 mmhg
pH, Ven: 7.341
pH, Ven: 7.349
pH, Ven: 7.354
pO2, Venous: 43.9 mmhg
pO2, Venous: 39.5 mmhg
pO2, Venous: 41.7 mmhg

## 2014-08-16 LAB — TOTAL HEMOGLOBIN GROUP
Hematocrit Total Calculated: 42.5 % (ref 40.0–54.0)
Hematocrit Total Calculated: 35.3 % — ABNORMAL LOW (ref 40.0–54.0)
Hematocrit Total Calculated: 23.6 % — ABNORMAL LOW (ref 40.0–54.0)
Hematocrit Total Calculated: 24.9 % — ABNORMAL LOW (ref 40.0–54.0)
Hematocrit Total Calculated: 25.4 % — ABNORMAL LOW (ref 40.0–54.0)
Hematocrit Total Calculated: 25.6 % — ABNORMAL LOW (ref 40.0–54.0)
Hemoglobin Total: 13.8 g/dL (ref 13.0–17.0)
Hemoglobin Total: 11.5 g/dL — ABNORMAL LOW (ref 13.0–17.0)
Hemoglobin Total: 7.6 g/dL — ABNORMAL LOW (ref 13.0–17.0)
Hemoglobin Total: 8 g/dL — ABNORMAL LOW (ref 13.0–17.0)
Hemoglobin Total: 8.2 g/dL — ABNORMAL LOW (ref 13.0–17.0)
Hemoglobin Total: 8.2 g/dL — ABNORMAL LOW (ref 13.0–17.0)

## 2014-08-16 LAB — RED BLOOD CELLS OR HOLD
Expiration Date: 201601012359
Expiration Date: 201601012359
UTYPE: A POS
UTYPE: A POS

## 2014-08-16 LAB — HEPARIN ASSAY
Heparin Assay Test Concentration: 2.7 mg/kg
Heparin Assay Test Concentration: >3.5 mg/kg
Heparin Assay Test Concentration: 3 mg/kg
Heparin Assay Test Concentration: 3.5 mg/kg
Heparin Assay Test Concentration: 2.5 mg/kg
Heparin Assay Test Concentration: 3 mg/kg
Heparin Assay Test Concentration: 0 mg/kg
Total Protamine Dose: 0 mg
Total Protamine Dose: 0 mg
Total Protamine Dose: 0 mg
Total Protamine Dose: 0 mg
Total Protamine Dose: 0 mg
Total Protamine Dose: 350 mg
Total Protamine Dose: 0 mg
Total Units of Heparin Required: 30000
Total Units of Heparin Required: 0
Total Units of Heparin Required: 0
Total Units of Heparin Required: 0
Total Units of Heparin Required: 0
Total Units of Heparin Required: 5000
Total Units of Heparin Required: 0

## 2014-08-16 LAB — THROMBOELASTOGRAPH CLOTTING PROFILE
TEG Angle: 69.7 (ref 55.2–78.4)
TEG CI: 1.4 (ref <=3.0)
TEG K Time: 1.4 (ref 0.8–2.8)
TEG MA: 73.6 mm — ABNORMAL HIGH (ref 50.6–69.4)
TEG R Time: 7 (ref 2.5–7.5)

## 2014-08-16 LAB — GLUCOSE WHOLE BLOOD
Whole Blood Glucose: 112 mg/dL — ABNORMAL HIGH (ref 70–100)
Whole Blood Glucose: 152 mg/dL — ABNORMAL HIGH (ref 70–100)
Whole Blood Glucose: 135 mg/dL — ABNORMAL HIGH (ref 70–100)
Whole Blood Glucose: 156 mg/dL — ABNORMAL HIGH (ref 70–100)
Whole Blood Glucose: 156 mg/dL — ABNORMAL HIGH (ref 70–100)
Whole Blood Glucose: 133 mg/dL — ABNORMAL HIGH (ref 70–100)

## 2014-08-16 LAB — PT AND APTT
PT INR: 1.3 — ABNORMAL HIGH (ref 0.9–1.1)
PT: 16 s — ABNORMAL HIGH (ref 12.6–15.0)
PTT: 33 s (ref 23–37)

## 2014-08-16 LAB — BASIC METABOLIC PANEL
BUN: 16 mg/dL (ref 9.0–28.0)
CO2: 23 mEq/L (ref 22–29)
Calcium: 8.2 mg/dL — ABNORMAL LOW (ref 8.5–10.5)
Chloride: 110 mEq/L (ref 100–111)
Creatinine: 0.9 mg/dL (ref 0.7–1.3)
Glucose: 122 mg/dL — ABNORMAL HIGH (ref 70–100)
Potassium: 4.6 mEq/L (ref 3.5–5.1)
Sodium: 138 mEq/L (ref 136–145)

## 2014-08-16 LAB — ACTIVATED CLOTTING TIME
ACT POCT: 162 — ABNORMAL HIGH (ref 85–120)
ACT POCT: 665 — ABNORMAL HIGH (ref 85–120)
ACT POCT: 580 — ABNORMAL HIGH (ref 85–120)
ACT POCT: 670 — ABNORMAL HIGH (ref 85–120)
ACT POCT: 509 — ABNORMAL HIGH (ref 85–120)
ACT POCT: 574 — ABNORMAL HIGH (ref 85–120)
ACT POCT: 135 — ABNORMAL HIGH (ref 85–120)

## 2014-08-16 LAB — POTASSIUM
Potassium: 3.7 mEq/L (ref 3.5–5.1)
Potassium: 4.7 mEq/L (ref 3.5–5.1)

## 2014-08-16 LAB — TROPONIN I
Troponin I: <0.01 ng/mL (ref 0.00–0.09)
Troponin I: 0.79 ng/mL — ABNORMAL HIGH (ref 0.00–0.09)

## 2014-08-16 LAB — GFR: EGFR: >60

## 2014-08-16 LAB — COLD AGGLUTININ SCREEN: Cold Screen: NEGATIVE

## 2014-08-16 SURGERY — CORONARY ARTERY BYPASS
Anesthesia: Anesthesia General | Site: Leg Upper | Wound class: Clean

## 2014-08-16 MED ORDER — HYDROMORPHONE HCL 1 MG/ML IJ SOLN
INTRAMUSCULAR | Status: AC
Start: 2014-08-16 — End: 2014-08-16
  Administered 2014-08-16: 0.5 mg via INTRAVENOUS
  Filled 2014-08-16: qty 1

## 2014-08-16 MED ORDER — ESMOLOL HCL 10 MG/ML IV SOLN
INTRAVENOUS | Status: AC
Start: 2014-08-16 — End: ?
  Filled 2014-08-16: qty 10

## 2014-08-16 MED ORDER — DIPHENHYDRAMINE HCL 50 MG/ML IJ SOLN
INTRAMUSCULAR | Status: DC | PRN
Start: 2014-08-16 — End: 2014-08-16
  Administered 2014-08-16: 50 mg via INTRAVENOUS

## 2014-08-16 MED ORDER — FAMOTIDINE 20 MG PO TABS
20.00 mg | ORAL_TABLET | Freq: Two times a day (BID) | ORAL | Status: DC
Start: 2014-08-16 — End: 2014-08-20
  Administered 2014-08-16 – 2014-08-20 (×9): 20 mg via ORAL
  Filled 2014-08-16 (×10): qty 1

## 2014-08-16 MED ORDER — NITROPRUSSIDE SODIUM 25 MG/ML IV SOLN
0.3000 ug/kg/min | INTRAVENOUS | Status: DC
Start: 2014-08-16 — End: 2014-08-17

## 2014-08-16 MED ORDER — PROPOFOL INFUSION 10 MG/ML
INTRAVENOUS | Status: DC | PRN
Start: 2014-08-16 — End: 2014-08-16
  Administered 2014-08-16: 50 ug/kg/min via INTRAVENOUS

## 2014-08-16 MED ORDER — MAGNESIUM SULFATE 50 % IJ SOLN
INTRAMUSCULAR | Status: AC
Start: 2014-08-16 — End: ?
  Filled 2014-08-16: qty 10

## 2014-08-16 MED ORDER — SODIUM CHLORIDE 0.9 % IV SOLN
INTRAVENOUS | Status: DC | PRN
Start: 2014-08-16 — End: 2014-08-16

## 2014-08-16 MED ORDER — PROPOFOL INFUSION 10 MG/ML
0.00 ug/kg/min | INTRAVENOUS | Status: DC
Start: 2014-08-16 — End: 2014-08-16
  Administered 2014-08-16: 50 ug/kg/min via INTRAVENOUS

## 2014-08-16 MED ORDER — ASPIRIN 325 MG PO TABS
325.00 mg | ORAL_TABLET | Freq: Every day | ORAL | Status: DC
Start: 2014-08-16 — End: 2014-08-20
  Administered 2014-08-16 – 2014-08-20 (×5): 325 mg via ORAL
  Filled 2014-08-16 (×5): qty 1

## 2014-08-16 MED ORDER — DIPHENHYDRAMINE HCL 50 MG/ML IJ SOLN
INTRAMUSCULAR | Status: AC
Start: 2014-08-16 — End: ?
  Filled 2014-08-16: qty 1

## 2014-08-16 MED ORDER — ROCURONIUM BROMIDE 50 MG/5ML IV SOLN
INTRAVENOUS | Status: DC | PRN
Start: 2014-08-16 — End: 2014-08-16
  Administered 2014-08-16: 50 mg via INTRAVENOUS
  Administered 2014-08-16: 30 mg via INTRAVENOUS
  Administered 2014-08-16: 50 mg via INTRAVENOUS
  Administered 2014-08-16: 20 mg via INTRAVENOUS

## 2014-08-16 MED ORDER — HYDROMORPHONE HCL 1 MG/ML IJ SOLN
0.2500 mg | INTRAMUSCULAR | Status: DC | PRN
Start: 2014-08-16 — End: 2014-08-16
  Administered 2014-08-16: 0.25 mg via INTRAVENOUS
  Filled 2014-08-16: qty 1

## 2014-08-16 MED ORDER — PROTAMINE SULFATE 10 MG/ML IV SOLN
INTRAVENOUS | Status: AC
Start: 2014-08-16 — End: ?
  Filled 2014-08-16: qty 25

## 2014-08-16 MED ORDER — SODIUM PHOSPHATE 3 MMOLE/ML IV SOLN
15.0000 mmol | INTRAVENOUS | Status: DC | PRN
Start: 2014-08-16 — End: 2014-08-16

## 2014-08-16 MED ORDER — NITROGLYCERIN IN D5W 200-5 MCG/ML-% IV SOLN
INTRAVENOUS | Status: DC | PRN
Start: 2014-08-16 — End: 2014-08-16
  Administered 2014-08-16: 100 ug via INTRAVENOUS

## 2014-08-16 MED ORDER — SUFENTANIL CITRATE 100 MCG/2ML IV SOLN
INTRAVENOUS | Status: DC | PRN
Start: 2014-08-16 — End: 2014-08-16
  Administered 2014-08-16: 50 ug via INTRAVENOUS
  Administered 2014-08-16: 25 ug via INTRAVENOUS
  Administered 2014-08-16 (×2): 50 ug via INTRAVENOUS
  Administered 2014-08-16: 25 ug via INTRAVENOUS

## 2014-08-16 MED ORDER — ALBUMIN HUMAN 5 % IV SOLN
INTRAVENOUS | Status: DC | PRN
Start: 2014-08-16 — End: 2014-08-16

## 2014-08-16 MED ORDER — MUPIROCIN 2% NASAL OINTMENT
TOPICAL_OINTMENT | Freq: Two times a day (BID) | CUTANEOUS | Status: DC
Start: 2014-08-16 — End: 2014-08-16

## 2014-08-16 MED ORDER — NITROGLYCERIN 5 MG/ML IV SOLN
INTRAVENOUS | Status: DC | PRN
Start: 2014-08-16 — End: 2014-08-16
  Administered 2014-08-16: 25 ug/min via INTRAVENOUS

## 2014-08-16 MED ORDER — ONDANSETRON 4 MG PO TBDP
4.00 mg | ORAL_TABLET | Freq: Three times a day (TID) | ORAL | Status: DC | PRN
Start: 2014-08-16 — End: 2014-08-20

## 2014-08-16 MED ORDER — DEXTROSE 50 % IV SOLN
50.00 mL | INTRAVENOUS | Status: DC | PRN
Start: 2014-08-16 — End: 2014-08-17

## 2014-08-16 MED ORDER — HYDROMORPHONE HCL 1 MG/ML IJ SOLN
0.5000 mg | INTRAMUSCULAR | Status: DC | PRN
Start: 2014-08-16 — End: 2014-08-17
  Administered 2014-08-16: 0.5 mg via INTRAVENOUS
  Filled 2014-08-16: qty 1

## 2014-08-16 MED ORDER — CALCIUM GLUCONATE 10 % IV SOLN
1.0000 g | INTRAVENOUS | Status: DC | PRN
Start: 2014-08-16 — End: 2014-08-17

## 2014-08-16 MED ORDER — SUFENTANIL CITRATE 250 MCG/5ML IV SOLN
INTRAVENOUS | Status: AC
Start: 2014-08-16 — End: ?
  Filled 2014-08-16: qty 5

## 2014-08-16 MED ORDER — LIDOCAINE HCL (PF) 2 % IJ SOLN
INTRAMUSCULAR | Status: AC
Start: 2014-08-16 — End: ?
  Filled 2014-08-16: qty 5

## 2014-08-16 MED ORDER — CHLORHEXIDINE GLUCONATE 0.12 % MT SOLN
15.0000 mL | Freq: Two times a day (BID) | OROMUCOSAL | Status: DC
Start: 2014-08-16 — End: 2014-08-17
  Administered 2014-08-16 (×2): 15 mL via OROMUCOSAL
  Filled 2014-08-16 (×4): qty 15

## 2014-08-16 MED ORDER — MIDAZOLAM HCL 2 MG/2ML IJ SOLN
INTRAMUSCULAR | Status: AC
Start: 2014-08-16 — End: ?
  Filled 2014-08-16: qty 4

## 2014-08-16 MED ORDER — AMIODARONE HCL 200 MG PO TABS
400.0000 mg | ORAL_TABLET | Freq: Three times a day (TID) | ORAL | Status: DC
Start: 2014-08-16 — End: 2014-08-16

## 2014-08-16 MED ORDER — TRAMADOL HCL 50 MG PO TABS
50.00 mg | ORAL_TABLET | ORAL | Status: DC | PRN
Start: 2014-08-16 — End: 2014-08-20
  Administered 2014-08-17 – 2014-08-19 (×4): 50 mg via ORAL
  Filled 2014-08-16 (×4): qty 1

## 2014-08-16 MED ORDER — MIDAZOLAM HCL 2 MG/2ML IJ SOLN
INTRAMUSCULAR | Status: AC
Start: 2014-08-16 — End: ?
  Filled 2014-08-16: qty 2

## 2014-08-16 MED ORDER — PROTAMINE SULFATE 10 MG/ML IV SOLN
INTRAVENOUS | Status: AC
Start: 2014-08-16 — End: ?
  Filled 2014-08-16: qty 5

## 2014-08-16 MED ORDER — SODIUM PHOSPHATE 3 MMOLE/ML IV SOLN
25.0000 mmol | INTRAVENOUS | Status: DC | PRN
Start: 2014-08-16 — End: 2014-08-16

## 2014-08-16 MED ORDER — PROPRANOLOL HCL 10 MG PO TABS
10.0000 mg | ORAL_TABLET | Freq: Four times a day (QID) | ORAL | Status: DC
Start: 2014-08-16 — End: 2014-08-16
  Administered 2014-08-16: 10 mg via ORAL
  Filled 2014-08-16: qty 1

## 2014-08-16 MED ORDER — AMIODARONE HCL 200 MG PO TABS
200.00 mg | ORAL_TABLET | Freq: Two times a day (BID) | ORAL | Status: DC
Start: 2014-08-16 — End: 2014-08-20
  Administered 2014-08-16 – 2014-08-20 (×9): 200 mg via ORAL
  Filled 2014-08-16 (×9): qty 1

## 2014-08-16 MED ORDER — NEOSTIGMINE METHYLSULFATE 1 MG/ML IJ SOLN
0.0500 mg/kg | Freq: Once | INTRAMUSCULAR | Status: AC
Start: 2014-08-16 — End: 2014-08-16
  Administered 2014-08-16: 4.21 mg via INTRAVENOUS
  Filled 2014-08-16: qty 10

## 2014-08-16 MED ORDER — SENNOSIDES-DOCUSATE SODIUM 8.6-50 MG PO TABS
1.00 | ORAL_TABLET | Freq: Every evening | ORAL | Status: DC
Start: 2014-08-16 — End: 2014-08-20
  Administered 2014-08-16 – 2014-08-19 (×4): 1 via ORAL
  Filled 2014-08-16 (×4): qty 1

## 2014-08-16 MED ORDER — ESMOLOL HCL 10 MG/ML IV SOLN
INTRAVENOUS | Status: DC | PRN
Start: 2014-08-16 — End: 2014-08-16
  Administered 2014-08-16: 20 mg via INTRAVENOUS

## 2014-08-16 MED ORDER — ONDANSETRON HCL 4 MG/2ML IJ SOLN
4.00 mg | Freq: Three times a day (TID) | INTRAMUSCULAR | Status: DC | PRN
Start: 2014-08-16 — End: 2014-08-20
  Administered 2014-08-16: 4 mg via INTRAVENOUS
  Filled 2014-08-16: qty 2

## 2014-08-16 MED ORDER — AMINOCAPROIC ACID 250 MG/ML IV SOLN
20.0000 g | INTRAVENOUS | Status: DC | PRN
Start: 2014-08-16 — End: 2014-08-16
  Administered 2014-08-16: 2 g/h via INTRAVENOUS

## 2014-08-16 MED ORDER — POTASSIUM CHLORIDE 20 MEQ/50ML IV SOLN
INTRAVENOUS | Status: DC | PRN
Start: 2014-08-16 — End: 2014-08-16
  Administered 2014-08-16: 20 meq via INTRAVENOUS

## 2014-08-16 MED ORDER — GLYCOPYRROLATE 0.2 MG/ML IJ SOLN
0.0100 mg/kg | Freq: Once | INTRAMUSCULAR | Status: AC
Start: 2014-08-16 — End: 2014-08-16
  Administered 2014-08-16: 0.842 mg via INTRAVENOUS
  Filled 2014-08-16: qty 5

## 2014-08-16 MED ORDER — LIDOCAINE HCL 2 % IJ SOLN
INTRAMUSCULAR | Status: DC | PRN
Start: 2014-08-16 — End: 2014-08-16
  Administered 2014-08-16: 100 mg

## 2014-08-16 MED ORDER — CEFUROXIME SODIUM 1.5 G IJ/IV SOLR (WRAP)
1.50 g | Freq: Three times a day (TID) | Status: AC
Start: 2014-08-16 — End: 2014-08-17
  Administered 2014-08-16 – 2014-08-17 (×3): 1.5 g via INTRAVENOUS
  Filled 2014-08-16 (×3): qty 1500

## 2014-08-16 MED ORDER — PROPOFOL 10 MG/ML IV EMUL
INTRAVENOUS | Status: AC
Start: 2014-08-16 — End: ?
  Filled 2014-08-16: qty 20

## 2014-08-16 MED ORDER — EPHEDRINE SULFATE 50 MG/ML IJ SOLN
INTRAMUSCULAR | Status: AC
Start: 2014-08-16 — End: ?
  Filled 2014-08-16: qty 1

## 2014-08-16 MED ORDER — BISACODYL 10 MG RE SUPP
10.00 mg | Freq: Every day | RECTAL | Status: DC | PRN
Start: 2014-08-16 — End: 2014-08-20

## 2014-08-16 MED ORDER — VEIN SOLUTION (OR ONLY) (FX)
Status: DC | PRN
Start: 2014-08-16 — End: 2014-08-16
  Administered 2014-08-16: 1

## 2014-08-16 MED ORDER — INSULIN REGULAR HUMAN 100 UNIT/ML IJ SOLN
100.0000 [IU] | INTRAMUSCULAR | Status: DC | PRN
Start: 2014-08-16 — End: 2014-08-16
  Administered 2014-08-16: 2 [IU]/h via INTRAVENOUS

## 2014-08-16 MED ORDER — LUBRIFRESH P.M. OP OINT
TOPICAL_OINTMENT | OPHTHALMIC | Status: AC
Start: 2014-08-16 — End: ?
  Filled 2014-08-16: qty 3.5

## 2014-08-16 MED ORDER — SODIUM PHOSPHATE 3 MMOLE/ML IV SOLN
35.0000 mmol | INTRAVENOUS | Status: DC | PRN
Start: 2014-08-16 — End: 2014-08-16

## 2014-08-16 MED ORDER — PROTAMINE SULFATE 10 MG/ML IV SOLN
INTRAVENOUS | Status: DC | PRN
Start: 2014-08-16 — End: 2014-08-16
  Administered 2014-08-16: 350 mg via INTRAVENOUS

## 2014-08-16 MED ORDER — POTASSIUM CHLORIDE 20 MEQ/50ML IV SOLN
INTRAVENOUS | Status: AC
Start: 2014-08-16 — End: ?
  Filled 2014-08-16: qty 50

## 2014-08-16 MED ORDER — HEPARIN SODIUM (PORCINE) 1000 UNIT/ML IJ SOLN
INTRAMUSCULAR | Status: DC | PRN
Start: 2014-08-16 — End: 2014-08-16
  Administered 2014-08-16: 30000 [IU] via INTRAVENOUS

## 2014-08-16 MED ORDER — PHENYLEPHRINE 100 MCG/ML IV BOLUS (ANESTHESIA)
PREFILLED_SYRINGE | INTRAVENOUS | Status: AC
Start: 2014-08-16 — End: ?
  Filled 2014-08-16: qty 10

## 2014-08-16 MED ORDER — PHENYLEPHRINE 100 MCG/ML IV BOLUS (ANESTHESIA)
PREFILLED_SYRINGE | INTRAVENOUS | Status: DC | PRN
Start: 2014-08-16 — End: 2014-08-16
  Administered 2014-08-16: 50 ug via INTRAVENOUS
  Administered 2014-08-16: 200 ug via INTRAVENOUS
  Administered 2014-08-16 (×2): 100 ug via INTRAVENOUS
  Administered 2014-08-16: 50 ug via INTRAVENOUS
  Administered 2014-08-16 (×2): 100 ug via INTRAVENOUS
  Administered 2014-08-16: 50 ug via INTRAVENOUS
  Administered 2014-08-16 (×5): 100 ug via INTRAVENOUS

## 2014-08-16 MED ORDER — PROPOFOL 10 MG/ML IV EMUL
INTRAVENOUS | Status: AC
Start: 2014-08-16 — End: ?
  Filled 2014-08-16: qty 50

## 2014-08-16 MED ORDER — ROCURONIUM BROMIDE 50 MG/5ML IV SOLN
INTRAVENOUS | Status: AC
Start: 2014-08-16 — End: ?
  Filled 2014-08-16: qty 10

## 2014-08-16 MED ORDER — HYDRALAZINE HCL 20 MG/ML IJ SOLN
10.0000 mg | INTRAMUSCULAR | Status: DC | PRN
Start: 2014-08-16 — End: 2014-08-17

## 2014-08-16 MED ORDER — MUPIROCIN 2% NASAL OINTMENT
TOPICAL_OINTMENT | Freq: Two times a day (BID) | CUTANEOUS | Status: DC
Start: 2014-08-16 — End: 2014-08-20
  Administered 2014-08-16 – 2014-08-20 (×8): 1 via NASAL
  Filled 2014-08-16 (×9): qty 1

## 2014-08-16 MED ORDER — MAGNESIUM SULFATE 50 % IJ SOLN
INTRAMUSCULAR | Status: DC | PRN
Start: 2014-08-16 — End: 2014-08-16
  Administered 2014-08-16: 2 g via INTRAVENOUS

## 2014-08-16 MED ORDER — LACTATED RINGERS IV SOLN
INTRAVENOUS | Status: DC | PRN
Start: 2014-08-16 — End: 2014-08-16

## 2014-08-16 MED ORDER — DEXTROSE 5 % IV SOLN
10.0000 g | INTRAVENOUS | Status: DC | PRN
Start: 2014-08-16 — End: 2014-08-16
  Administered 2014-08-16: 10 g via INTRAVENOUS

## 2014-08-16 MED ORDER — MAGNESIUM SULFATE IN D5W 10-5 MG/ML-% IV SOLN
1.0000 g | INTRAVENOUS | Status: DC | PRN
Start: 2014-08-16 — End: 2014-08-17

## 2014-08-16 MED ORDER — LACTATED RINGERS IV BOLUS
250.0000 mL | INTRAVENOUS | Status: DC | PRN
Start: 2014-08-16 — End: 2014-08-17
  Administered 2014-08-16 (×2): 500 mL via INTRAVENOUS

## 2014-08-16 MED ORDER — SODIUM CHLORIDE 0.9 % IV SOLN
INTRAVENOUS | Status: DC
Start: 2014-08-16 — End: 2014-08-20

## 2014-08-16 MED ORDER — NICARDIPINE IV BOLUS SYRINGE (ANESTHESIA)
INTRAVENOUS | Status: AC
Start: 2014-08-16 — End: ?
  Filled 2014-08-16: qty 5

## 2014-08-16 MED ORDER — EPHEDRINE SULFATE 50 MG/ML IJ SOLN
INTRAMUSCULAR | Status: DC | PRN
Start: 2014-08-16 — End: 2014-08-16
  Administered 2014-08-16 (×2): 5 mg via INTRAVENOUS

## 2014-08-16 MED ORDER — ACETAMINOPHEN 325 MG PO TABS
650.00 mg | ORAL_TABLET | ORAL | Status: DC | PRN
Start: 2014-08-16 — End: 2014-08-20
  Administered 2014-08-17: 650 mg via ORAL
  Filled 2014-08-16: qty 2

## 2014-08-16 MED ORDER — SODIUM CHLORIDE 0.9 % IV SOLN
0.50 [IU]/h | INTRAVENOUS | Status: DC
Start: 2014-08-16 — End: 2014-08-17
  Filled 2014-08-16: qty 1

## 2014-08-16 MED ORDER — HYDROMORPHONE HCL 1 MG/ML IJ SOLN
0.50 mg | Freq: Once | INTRAMUSCULAR | Status: AC
Start: 2014-08-16 — End: 2014-08-16

## 2014-08-16 MED ORDER — POTASSIUM CHLORIDE 20 MEQ/50ML IV SOLN
20.00 meq | INTRAVENOUS | Status: DC | PRN
Start: 2014-08-16 — End: 2014-08-17
  Administered 2014-08-16: 20 meq via INTRAVENOUS
  Filled 2014-08-16: qty 50

## 2014-08-16 MED ORDER — MIDAZOLAM HCL 2 MG/2ML IJ SOLN
INTRAMUSCULAR | Status: DC | PRN
Start: 2014-08-16 — End: 2014-08-16
  Administered 2014-08-16: 2 mg via INTRAVENOUS
  Administered 2014-08-16 (×2): 1 mg via INTRAVENOUS
  Administered 2014-08-16: 2 mg via INTRAVENOUS

## 2014-08-16 MED ORDER — PROPOFOL INFUSION 10 MG/ML
INTRAVENOUS | Status: DC | PRN
Start: 2014-08-16 — End: 2014-08-16
  Administered 2014-08-16: 70 mg via INTRAVENOUS

## 2014-08-16 MED ORDER — OXYCODONE-ACETAMINOPHEN 5-325 MG PO TABS
1.00 | ORAL_TABLET | ORAL | Status: DC | PRN
Start: 2014-08-16 — End: 2014-08-20
  Administered 2014-08-17 – 2014-08-20 (×9): 1 via ORAL
  Filled 2014-08-16 (×9): qty 1

## 2014-08-16 MED ORDER — SODIUM CHLORIDE 0.9 % IV MBP
1.5000 g | INTRAVENOUS | Status: DC
Start: 2014-08-16 — End: 2014-08-16
  Administered 2014-08-16: 1.5 g via INTRAVENOUS

## 2014-08-16 MED ORDER — CHLORHEXIDINE GLUCONATE 0.12 % MT SOLN
15.0000 mL | Freq: Once | OROMUCOSAL | Status: DC
Start: 2014-08-16 — End: 2014-08-16

## 2014-08-16 MED ORDER — POLYETHYLENE GLYCOL 3350 17 G PO PACK
17.0000 g | PACK | Freq: Every day | ORAL | Status: DC
Start: 2014-08-17 — End: 2014-08-20
  Administered 2014-08-17 – 2014-08-19 (×3): 17 g via ORAL
  Filled 2014-08-16 (×3): qty 1

## 2014-08-16 MED ORDER — NITROGLYCERIN IN D5W 200-5 MCG/ML-% IV SOLN
10.0000 ug/min | INTRAVENOUS | Status: DC
Start: 2014-08-16 — End: 2014-08-17
  Administered 2014-08-16: 10 ug/min via INTRAVENOUS

## 2014-08-16 MED ORDER — CHLORHEXIDINE GLUCONATE 0.12 % MT SOLN
OROMUCOSAL | Status: AC
Start: 2014-08-16 — End: 2014-08-16
  Administered 2014-08-16: 30 mL via OROMUCOSAL
  Filled 2014-08-16: qty 15

## 2014-08-16 MED ORDER — SODIUM CHLORIDE 0.9 % IR SOLN
Status: DC | PRN
Start: 2014-08-16 — End: 2014-08-16
  Administered 2014-08-16: 4000 mL

## 2014-08-16 MED ORDER — SODIUM CHLORIDE 0.9 % IJ SOLN
INTRAMUSCULAR | Status: AC
Start: 2014-08-16 — End: ?
  Filled 2014-08-16: qty 10

## 2014-08-16 MED ORDER — INSULIN REGULAR HUMAN 100 UNIT/ML IJ SOLN
100.0000 [IU] | INTRAMUSCULAR | Status: DC | PRN
Start: 2014-08-16 — End: 2014-08-16
  Administered 2014-08-16: 3 [IU] via INTRAVENOUS
  Administered 2014-08-16: 2 [IU] via INTRAVENOUS

## 2014-08-16 MED FILL — Heparin Sodium (Porcine) Inj 1000 Unit/ML: INTRAMUSCULAR | Qty: 50 | Status: AC

## 2014-08-16 MED FILL — Phenylephrine HCl Inj 10 MG/ML: INTRAMUSCULAR | Qty: 1 | Status: AC

## 2014-08-16 MED FILL — Electrolyte-A Solution: Qty: 200 | Status: AC

## 2014-08-16 MED FILL — Sodium Bicarbonate IV Soln 8.4%: INTRAVENOUS | Qty: 50 | Status: AC

## 2014-08-16 MED FILL — Mannitol IV Soln 25%: INTRAVENOUS | Qty: 100 | Status: AC

## 2014-08-16 MED FILL — Water For Inject, Bacteriostatic Benzyl Alcohol: INTRAMUSCULAR | Qty: 30 | Status: AC

## 2014-08-16 MED FILL — Cardioplegic Soln w/ Lidocaine: Qty: 2105.6 | Status: AC

## 2014-08-16 SURGICAL SUPPLY — 157 items
ADAPTER CORONARY CARDIOPLEGIA (Adapter) ×5 IMPLANT
ADHESIVE SKIN SURGISEAL .35ML (Suture) ×5 IMPLANT
APPLCATOR CHLORAPREP 26ML (Prep) ×45 IMPLANT
BAG DECANTER STERILE (Procedure Accessories) ×5 IMPLANT
BANDAGE COBAN STERILE 6IN LF (Bandage) ×1
BLADE BEAVER 6900 MINI BLUE (Blade) ×5 IMPLANT
BLADE COATED 2.75 HEX (Blade) ×3
BLADE MICRO SHARP 15 DEG 3MM (Blade) ×10 IMPLANT
BLADE MINI ES SHP (Blade) ×5 IMPLANT
BLADE S/SU RIBBACK CARB STL 11 (Blade) ×10 IMPLANT
BLADE SURG 10 (Blade) ×10 IMPLANT
CABLE MYO/LEAD BLUE (Cable) ×1
CABLE MYO/LEAD WHITE (Cable) ×1
CABLE PATIENT MYO/LEAD L1.8 MR BLUE (Cable) ×3 IMPLANT
CABLE PATIENT MYO/LEAD L1.8 MR WHITE (Cable) ×3 IMPLANT
CABLE PT MYOLD 1.8MR DISP BLU (Cable) ×1
CABLE PT MYOLD 1.8MR DISP WHT (Cable) ×1
CANNULA PERFUSION VENOUS BLUNT TIP (Cannula) ×6
CANNULA PERFUSION VENOUS VEIN IRRIGATION BLUNT TIP IRRIGATION (Cannula) ×6 IMPLANT
CANNULA PRFSN LF NS BLNT TIP IRR VN (Cannula) ×2
CANNULA RETROPLECIA COR SINUSM (Cannula) ×5 IMPLANT
CANNULA VEIN IRRIG BVL TIP (Cannula) ×2
CATH THORACIC RT ANG STRL 28F (Drain) ×5 IMPLANT
CLAMP SURGICAL INSERT SOFT/TRA (Clips) ×10 IMPLANT
CLIP INTERNAL MEDIUM CHEVRON 6 CARTRIDGE (Clips) ×6
CLIP INTERNAL MEDIUM CHEVRON 6 CARTRIDGE LIGATE TRIANGULATE CROSS (Clips) ×6 IMPLANT
CLIP INTERNAL SMALL CHEVRON 24 CARTRIDGE (Clips) ×3
CLIP INTERNAL SMALL CHEVRON 24 CARTRIDGE LIGATE TRIANGULATE CROSS (Clips) ×3 IMPLANT
CLIP INTNL TI MED CHEVRON WECK HRZN 6 (Clips) ×2
CLIP INTNL TI SM CHEVRON WECK HRZN 24 (Clips) ×1
CLIP LIGATING TITANIUM SMALL (Clips) ×1
CLIP SPRING AVD DBL TRAC 6MM (Clips) ×5 IMPLANT
CLIP SURGICAL SPRING SOFT LIGH (Clips) ×15 IMPLANT
CLIP TITANIUM LIGATING MED (Clips) ×2
CONNECTOR PERFUSION 3/8IN Y TMP STERILE (Connector) ×6 IMPLANT
CONNECTOR PRFSN Y TMP 3/8IN STRL (Connector) ×8
CONNECTOR Y 3/8X3/8X3/8IN (Connector) ×2
DRAIN CHEST ADULT DRY SUCTION (Drain) ×2
DRAIN CHEST THORACIC TUBE DRY SUCTION (Drain) ×6
DRAIN CHEST THORACIC TUBE DRY SUCTION COLLECTION CHAMBER ATRIUM OASIS (Drain) ×6 IMPLANT
DRAIN INCS PLS ATR OAS LF STRL TUBE DRY (Drain) ×2
DRAPE INCISE IOBAN-2 90X45CM (Drape) ×3
DRAPE SLUSH MACHINE 66 X 44 (Drape) ×10 IMPLANT
DRAPE SRG IOBN 23X17IN STRL 2 INCS FLM (Drape) ×3
DRAPE SURGICAL 2 INCISE FILM (Drape) ×9 IMPLANT
DRESSING HEMOSTAT 2X14IN ABSOR (Dressing) ×5 IMPLANT
DRESSING MEPILEX BORDER 18X18 (Dressing) ×5 IMPLANT
DRESSING OPTIFOAM 7X7IN (Dressing) ×5 IMPLANT
ELECTRODE ELECTROSURGICAL BLADE L2.5 IN (Blade) ×9
ELECTRODE ELECTROSURGICAL BLADE L2.5 IN OD3/32 IN EDGE L1.1 IN (Blade) ×9 IMPLANT
ELECTRODE ESURG BLDE EDG 3/32IN 2.5IN LF (Blade) ×3
GAUZE KERLIX 4.5X4YDS (Dressing) ×5 IMPLANT
GAUZE SPONGE CURITY 12PLY 4X8 (Dressing) ×5 IMPLANT
GAUZE SPONGE VERSLN 4PLY 4X4IN (Dressing) ×5 IMPLANT
GLOVE 7.0 PI ULTRA TOUCH (Glove) ×2
GLOVE SRG PLISPRN 6.5 BGL PI INDCTR (Glove) ×7
GLOVE SRG PLISPRN 7 BGL PI ULTRATOUCH G (Glove) ×2
GLOVE SURG BIOGEL PF LTX SZ7.0 (Glove) ×5 IMPLANT
GLOVE SURG BIOGEL SZ7.5 (Glove) ×5 IMPLANT
GLOVE SURG NEUTRALON SZ 7.5 (Glove) ×5 IMPLANT
GLOVE SURG UNDER STRL 6.5 (Glove) ×7
GLOVE SURG USE LAWSON 31282 (Glove) ×10 IMPLANT
GLOVE SURGICAL 6 1/2 BIOGEL PI INDICATOR (Glove) ×21
GLOVE SURGICAL 6 1/2 BIOGEL PI INDICATOR UNDERGLOVE POWDER FREE SMOOTH (Glove) ×21 IMPLANT
GLOVE SURGICAL 7 BIOGEL PI ULTRATOUCH G (Glove) ×6
GLOVE SURGICAL 7 BIOGEL PI ULTRATOUCH G POWDER FREE BEAD CUFF (Glove) ×6 IMPLANT
GOWN SMART IMPERVIOUS LARGE (Gown) ×15 IMPLANT
GOWN SMART XLG (Gown) ×3
GOWN SRG XL SMARTGOWN LF STRL LVL 4 (Gown) ×3
GOWN SURGICAL XL SMARTGOWN LEVEL 4 (Gown) ×9
GOWN SURGICAL XL SMARTGOWN LEVEL 4 BREATHABLE (Gown) ×9 IMPLANT
INACTIVE USE LAWSON 110816 (Procedure Accessories) ×5 IMPLANT
INHIBITOR ANTI FOG (Procedure Accessories) ×10 IMPLANT
KIT NURSE CABG IFH (Kits) ×5 IMPLANT
NEEDLE AIR ASPIRATOR 16G (Needles) ×1
NEEDLE BIOPSY OD16 GA NEEDLE, AIR ASPIRATOR 16 GA W/BLUE CAP AIR (Needles) ×3 IMPLANT
NEEDLE BX 16GA LF STRL AIR ASP SHRP BVL (Needles) ×4
PEN ELECTRO PUSH BUTN HOLSTER (Cautery) ×5 IMPLANT
PENCIL ELECTRO PUSH BUTTON (Cautery) ×5 IMPLANT
PLASMA LYTE A 1000ML PH7.4 LTX (IV Solutions) ×4
PUNCH AORTIC 4.00 MM (Procedure Accessories) ×2
PUNCH AORTIC 4.5MM (Procedure Accessories) ×5 IMPLANT
PUNCH AORTIC OD4 MM ROTATE 2 CUT ACTION (Procedure Accessories) ×3
PUNCH AORTIC OD4 MM ROTATE 2 CUT ACTION LOW PROFILE CUT TIP SYRINGE (Procedure Accessories) ×3 IMPLANT
SENSOR PAD LEVEL (Procedure Accessories) ×2
SENSOR PERFUSION LEVEL DETECTOR SARNS (Procedure Accessories) ×6 IMPLANT
SENSOR PRFSN SRN LVL DTCTR DISP (Procedure Accessories) ×2
SLEEVE CMPR MED KN LGTH KDL SCD 21- IN (Sleeve) ×4
SLEEVE COMPRESSION MEDIUM KNEE LENGTH KENDALL SEQUENTIAL OD21- IN (Sleeve) ×3 IMPLANT
SLEEVE SEQ COMP KNEE REGULAR (Sleeve) ×1
SOLUTION IV PLASMA-LYTE A 1000 ML PH 7.4 (IV Solutions) ×12
SOLUTION IV PLASMA-LYTE A 1000 ML PH 7.4 TYPE 1 (IV Solutions) ×12 IMPLANT
SOLUTION IV PLSMLT A 1000ML VFLX LF PH (IV Solutions) ×4
SPONGE GAUZE L4 IN X W4 IN 12 PLY (Sponge) ×3 IMPLANT
SPONGE GAUZE STR 10'S 12PLY4X4 (Sponge) ×1
SPONGE GZE PLS CTTN CRTY 4X4IN LF STRL (Sponge) ×1
SUCTION YANKAUER W/CONTROL (Suction) ×1
SUTR VICRYL 4-0 FS1 (STRAIGHT) (Suture) ×4
SUTURE ABS 4-0 FS1 VCL 27IN BRD COAT UD (Suture) ×4
SUTURE COATED VICRYL 4-0 FS-1 L27 IN (Suture) ×12 IMPLANT
SUTURE ETHIBOND SH-2 GREEN (Suture) ×10 IMPLANT
SUTURE NABSB 4-0 RB1 PRLN 36IN 2 ARM MFL (Suture) ×5
SUTURE NABSB SLK 3-0 PRMHND 144IN BRD (Suture) ×1
SUTURE NABSB SLK 5-0 RB1 PRMHND 30IN BRD (Suture) ×2
SUTURE NABSB SS 5 CCS 18IN MFL 4 STRN (Suture) ×1
SUTURE NABSB SS 5 V40 TPRCT 18IN MFL 4 (Wire) IMPLANT
SUTURE POLYPROPYLENE PROLENE BLUE 4-0 (Suture) ×15
SUTURE POLYPROPYLENE PROLENE BLUE 4-0 RB-1 1/2 CIRLE L36 IN 2 ARM (Suture) ×15 IMPLANT
SUTURE PROLENE 4-0 RB1 36IN (Suture) ×5
SUTURE PROLENE 4-0 SH 36IN (Suture) ×15 IMPLANT
SUTURE PROLENE 5-0 RB2 30IN (Suture) ×20 IMPLANT
SUTURE PROLENE 6-0 BV1 30IN (Suture) ×5 IMPLANT
SUTURE PROLENE 7-0 BV1 24IN (Suture) ×15 IMPLANT
SUTURE PROLENE 8-0 BV175-6 (Suture) ×15 IMPLANT
SUTURE SILK 0.24IN (Suture) ×5 IMPLANT
SUTURE SILK 1 24IN (Suture) ×5 IMPLANT
SUTURE SILK 2-0 CT1 8X18IN (Suture) ×5 IMPLANT
SUTURE SILK 2.0 FSL 30IN (Suture) ×25 IMPLANT
SUTURE SILK 3-0 12FT (Suture) ×1
SUTURE SILK 5-0 RB1 30IN (Suture) ×2
SUTURE SILK PERMA HAND BLACK 3-0 L144 IN (Suture) ×3
SUTURE SILK PERMA HAND BLACK 3-0 L144 IN BRAID REEL NONABSORBABLE (Suture) ×3 IMPLANT
SUTURE SILK PERMA HAND BLACK 5-0 RB-1 (Suture) ×6
SUTURE SILK PERMA HAND BLACK 5-0 RB-1 L30 IN BRAID NONABSORBABLE (Suture) ×6 IMPLANT
SUTURE SS SILVER 5 V-40 L18 IN MNFLMNT 4 STRAND NNBSRBBL (Wire) IMPLANT
SUTURE SS STEEL SILVER 5 CCS L18 IN MNFLMNT 4 STRAND NNBSRBBL (Suture) ×3 IMPLANT
SUTURE STAINLESS STEEL STEEL SILVER 5 (Suture) ×3
SUTURE STAINLESS STEEL STEEL SILVER 5 (Wire) IMPLANT
SUTURE STEEL 5 CCS 4X18IN (Suture) ×1
SUTURE STEEL 5 V40 4X18IN (Wire) IMPLANT
SUTURE VICRYL 0 CT1 36IN (Suture) ×10 IMPLANT
SUTURE VICRYL 2-0 CT1 36IN (Suture) ×5 IMPLANT
SYSTEM VASOVIEW VESSEL HARVEST (Endoscopic Supplies) ×5 IMPLANT
TAPE MEDIPORE 2INX10YD (Tape) ×2
TAPE SRG CLTH MDPR 10YDX2IN LF NS STD (Tape) ×2
TAPE SURGICAL L10 YD X W2 IN STANDARD (Tape) ×6
TAPE SURGICAL L10 YD X W2 IN STANDARD MEDIPORE CLOTH (Tape) ×6 IMPLANT
TIP SCT TPR BLBS MDVC YNKR LF STRL CNTRL (Suction) ×1
TIP SCT VNYL LG ARG YNKR SLC-TRL 24FR LF (Disposable Instruments) ×1
TIP SUCTION MEDI-VAC YANKAUER TAPER (Suction) ×3
TIP SUCTION MEDI-VAC YANKAUER TAPER BULBOUS CONTROL VENT HANDLE (Suction) ×3 IMPLANT
TIP SUCTION SELEC-TROL LARGE OD24 FR (Disposable Instruments) ×3
TIP SUCTION SELEC-TROL LARGE OD24 FR VINYL (Disposable Instruments) ×3 IMPLANT
TOWEL L26 IN X W17 IN COTTON PREWASH DELINT BLUE ACTISORB DELUXE (Procedure Accessories) ×9 IMPLANT
TOWEL OR STRL DLUX BLUE 10PK (Procedure Accessories) ×3
TOWEL SRG CTTN 26X17IN LF STRL PREWASH (Procedure Accessories) ×12
TUBING ATS SUCTION LINE (Tubing) ×5 IMPLANT
TUBING INSUFFLATION .1 (Tubing) ×5 IMPLANT
UNDRGLOV SZ 7 PI INDICATR BLUE (Glove) ×10 IMPLANT
VESSEL LOOPS MAXI BLUE STERILE (Procedure Accessories) ×5 IMPLANT
WAX BN STRL 2.5G NTR (Hemostat) ×2
WAX BONE 2.5 G NATURAL (Hemostat) ×6 IMPLANT
WAX BONE 2.5GM (Hemostat) ×2
WRAP CMPR POR CBN 5YDX6IN LF STRL SLFADH (Bandage) ×1
WRAP COMPRESSION L5 YD X W6 IN SELF (Bandage) ×3
WRAP COMPRESSION L5 YD X W6 IN SELF ADHERENT ELASTIC LIGHTWEIGHT HAND (Bandage) ×3 IMPLANT
YANKAUER ARGYLE SUCT OPEN TIP (Disposable Instruments) ×1

## 2014-08-16 NOTE — Plan of Care (Signed)
Pt awaiting transfer to [facility]; Vernona Rieger RN accepting pt to room 265. Pt call bell in reach and no complaints of pain at this time.

## 2014-08-16 NOTE — Plan of Care (Signed)
Problem: Moderate/High Fall Risk Score >/=15  Goal: Patient will remain free of falls  Outcome: Completed Date Met:  08/16/14  Patient neuro intact. Patient able to follow commands and express needs. Patient OOB independently with steady gait-no DOE/SOB. Patient at low risk for falls per screening tool-low interventions in place as per protocol.    Problem: Pre-op Phase- Cardiac Surgery  Goal: Patient will be ready for surgery  Outcome: Completed Date Met:  08/16/14   Patient denies chest pain or difficulty breathing. Patient NSR on tele-see doc flow for vitals and assessment.   Patient for CABG 12/3. Patient had CHG bath last night and this AM. NPO since midnight.  Patient given CV surgery education booklet. Information reviewed with patient including sternal precautions and POC s/p surgery. Meplex to be applied.

## 2014-08-16 NOTE — Anesthesia Procedure Notes (Addendum)
TEE      Performed by: Quy Lotts A    Authorized by: Phylliss Strege, Ree Kida A      Procedure Info    Indication:  Myocardial Function  Surgery Class:  CABG  Examiner:  Copelyn Widmer A  TEE CPT Codes:  [phone] Adult TEE with interpretation, 93320 Doppler ECHO and 91478 Doppler ECHO (Color Flow)  History of Esophageal Stricture?: No    History of Radiation Therapy?: No    Hemoptysis?: No    Probe Placement:  Bite Block used and Atraumatic  TEE Attestation:  DIAGNOSTIC This TEE is for diagnostic purposes.  Pelase refer to written report for details regarding the exam    Aorta    Asc Aortic Disease:  None  Desc Aortic Disease:  None    Pericardium    Pericardial Effusion:  None  Pericardial Thickening:  None    Left Ventricle    LViD Exceeds 9cm: No    LVH-Wall exceeds 1cm: Yes    Ejection Fraction:  45-55%  LV Thrombus:  None  LV Septum:  Normal  Pre-surgery RWM Anterior wall:  Normal  Pre-surgery RWM Lateral Wall:  Normal  Pre-surgery RWM Septum:  Mild HK  Pre-surgery RWM Inferior Wall:  Mild HK  Pre-surgery RWM Apex:  Normal  Post-surgery RWM Anterior wall:  Normal  Post-surgery RWM Lateral Wall:  Normal  Post-surgery RWM Septum:  Mild HK  Post-surgery RWM Inferior Wall:  Normal  Post-surgery RWM Apex:  Normal    Left Atrium    : normal.  Left Atrial Septum:  Normal  ASD:  None  LA Appendage:  Normal    Right Ventricle    RV Hypertrophy: No    RV Size:  Normal  RV Function:  Normal    Right Atrium      Aortic Valve    Native AV Type:  Tricuspid  Native AV:  Normal    AR Grade:  None    Mitral Valve    Native Mitral Valve Type:  Normal  Native Mitral Valve:  Central Regurg    MR Grade:  1+    Tricuspid Valve    Native Tricuspid Valve Type:  Normal  Native Tricuspid Valve:  Central Regurg  TR Grade:  1+    Intracardiac Masses

## 2014-08-16 NOTE — Plan of Care (Signed)
Problem: Post-op Cardiac Surgery  Goal: Maintain adequate oxygentation  Outcome: Progressing  Goal: Early mobilization is achieved  Outcome: Progressing  Goal: Skin integrity is maintained or improved  Outcome: Progressing  Pt in chair awaiting transfer to CVSD; call bell within reach and no complaints at this time. Pt achieved on IS and has been weaned off oxygen. Current O2 sats are 96%. Pt ambulated to chair without incidence.

## 2014-08-16 NOTE — Plan of Care (Signed)
Problem: Moderate/High Fall Risk Score >/=15  Goal: Patient will remain free of falls  Outcome: Progressing  Reactivated Fall care plan since postop from open heart surgery. Pt. Is postop today from Cabg with Dr. AD. Admitted at 1245. Accompanied by anesthesia and RN during transport via bed. Pt. Monitored during transport. Report received from anesthesia, pt. Connected to vent by RRT. Vitals stable upon admission. Nitroglycerin started for SBP  120 upon admission. Other IV drips include insulin and propofol upon admission. Pt. Reversal of anesthesia given at 1320. Waking slowly postop. Moves all extremities and follows commands. Nods head appropriately to questions. SR 80s. A and V epicardial wires connected to pacer; pacer off at this time. On CPAP vent settings at this time. Will draw abg on cpap. Albumin x 1 given. Cardiac index excellent > 2.2. Skin warm and dry. CT x 4 output WDL. Dressings to sternum and left leg CDI. Urine output excellent at this time. Plan: continue to wean to extubate. May be RABBIT this evening. Will continue to monitor CT output/hemodynamics.

## 2014-08-16 NOTE — Op Note (Signed)
FINAL                    [facility]                              Operative Report        Patient Name:              Jimmy Cardenas, Jimmy Cardenas  MRN:                       [id number]  DOB:                       1966/09/10  Surgery Date:              08/16/2014  Performing Facility:       [facility]  Admission Date:            08/14/2014  Surgeon:                   Maurie Olesen,M.D.  Ref Cardiologist:          Truesdell,Alexander,M.D.           Surgery Indications      CAD        Procedure(s):    CABGx 3     Clinical History  CARDIAC SURGERY  EVALUATION IS REQUESTED  FOR THIS 49  YEAR OLD MALE  WITH PROGRESSIVELY  WORSENING DYSPNEA  ON EXERTION AND  CHEST  DISCOMFORT. MYOCARDIAL  PERFUSION  STUDY DONE BY HIS  PCP SHOWED  INFERIOR WALL ISCHEMIA  AND MILDLY  REDUCED SYSTOLIC  FUNCTION AND HE  WAS SENT FOR CARDIOLOGY  EVALUATION.   CARDIAC CATH  WAS DONE ON  08/14/14 BY DR. Idolina Primer  AND REVEALED  90% DISTAL LEFT  MAIN DISEASE  AND SUBTOTAL MID  RCA OCCLUSION WITH  AND EF OF 45-  50%.  HE IS  REFERRED FOR CABG.  CARDIAC CATH 08/14/14:    LEFT MAIN-  90% DISTAL OCCLUSION,   MID RCA-  SUBTOTAL OCCLUSION,   EF 45-50%,     ECHO 08/14/14:  RESULTS  PENDING     STS RISK SCORE:   MORTALITY = 0.28%,   M/M= 6.13%\     LABS (08/07/14):  K+ 4.6,  BUN 21,  CREAT 1.08,  PLT  281,  HGB 13.3,  HCT 40.2,  WBC 8.4           Pre Procedure  STS mortality risk  score was determined  and discussed  with the  patient All of the  patient's questions  were answered  to their  satisfaction.  Informed  consent was  obtained.  Description of Early  Procedure  The patient was  brought to the operating  room and  placed in the  supine position.   General anesthesia  was induced.   All monitoring  lines were placed  percutaneously.   The neck, chest,  abdomen and  lower extremities  were prepped and  draped in the usual  sterile  fashion.  The operative  approach  in this case was  that  of full  sternotomy and cardiopulmonary  bypass.   The pericardium  was opened  and suspended.     Myocardial Protection  The method of delivery  of myocardial  protection was  that of a  cardioplegic solution,  delivered  in antegrade and  retrograde  fashion. Cardioplegia  was delivered  through the aortic  root catheter  and through the  coronary sinus catheter.   Reperfusion  of the  myocardium was accomplished  with  warm blood cardioplegia  solution.  Myocardial protection  was excellent.     Operative Technique  Using digital palpation,  the aorta  was determined  to be normal.  The  following arterial  and venous conduits  were obtained:     Bypass Graft Obtained   Quality      Harvest  Method  left mammary artery     good         pedicled  right mammary artery    good         pedicled  left leg                good         endo     All arterial small  branches were  controlled with metal  clips.  Small  branches on the  vein graft were controlled  with ligatures  and clips.  The patient was  then fully heparinized,  and the conduits  were  prepared for grafting.   The coronary  artery was opened  and distal  and proximal snares  were used to  achieve a clean operative  field.  Bypass grafting  was performed as  noted below.                                Distal    Distal      Proximal                 Artery         AnastomosAnastomosisAnastomosis       Seq                                is  SeqVessel   mm QualityCondui  Site      ConfiguratiConfiguration    Graft                         t                on                                Posterior                         Vein  1  RCA      1.5poor           DescendinEnd  to side                 Yes                         graft                                g (PDA)  Obtuse     Left                Free  2           1.7good           Marginal  End to side     Main                IMA                                1                          In     Distal     Left  3           1.5good    Situ   LAD       End to side     Main                         LIMA        Closing  Phase  After  careful maneuvers  to evacuate the  air, the cross clamp  was  removed.   The coronary  sinus catheter was  then removed.   Vessel  patency  was assessed by  intraoperative  doppler and found  to be  satisfactory.   Cardiac  rhythm post-bypass  was normal sinus  rhythm.  The patient  was decannulated,  and following  meticulous hemostasis,  the sternum  was approximated  with interrupted  #5 stainless  steel  wire while  the linea alba  and fascia were  closed with Vicryl.  Hemodynamic  stability was  achieved and  the chest was closed  in  layers  after leaving mediastinal,  right,  and left pleural  tubes in  place.   Post-operative  TEE showed normal  LV function.  Sponge  and  needle  counts were correct.   A message  was conveyed to the  family  that the  operation was  completed.     Additional  Comments  Bilateral  IMA's as a T  graft. Intramuscular  LAD           Alvira Philips Kaoir Loree,  M.D.   electronically  signed on  08/16/2014 2:16:06  PM with  status  of Final

## 2014-08-16 NOTE — Transfer of Care (Signed)
Anesthesia Transfer of Care Note    Patient: Jimmy Cardenas    Procedures performed: Procedure(s) with comments:  CORONARY ARTERY BYPASS x 3 - [place]  Cardioplegia.   LIMA to LAD  RIMA  to OM (T graft off of LIMA)  LSVG to RPDA     ENDOSCOPIC,VEIN HARVEST - LSVG harvested by M. Christopher PA, leg incision at 08:00, out of leg at 08:50, preparation finished at 09:05 .  Harvested from groin to knee, closed by M. Christopher PA  TEE - probe # [id number], Performed by Dr. Alesia Richards    Anesthesia type: General ETT    Patient location:ICU    Last vitals:   Filed Vitals:    08/16/14 0609   BP: 142/86   Pulse: 82   Temp:    Resp: 16   SpO2:    BP 115/59, PA 30/12 NSR 59, SPO2 100%, vented per cv protocol.  Full report given to CVICU team.  Post pain: Patient not complaining of pain, continue current therapy      Mental Status:sedated    Respiratory Function: intubated    Cardiovascular: stable    Nausea/Vomiting: patient not complaining of nausea or vomiting    Hydration Status: adequate    Post assessment: will follow up

## 2014-08-16 NOTE — OR Nursing (Signed)
06:35 interviewed pt in pre op per cvor protocol.  10:30 called report to CVICU

## 2014-08-16 NOTE — Plan of Care (Signed)
Problem: Post-op Cardiac Surgery  Goal: Maintain adequate oxygentation  Outcome: Progressing  Pt. Extubated to NC at 1530. Doing well. nonlabored breathing. Able to wean NC to 4 LPM and o2 sat is 100%. Will continue to titrate o2.  Intervention: Keep pain at an acceptable level for patient.  Patient c/o discomfort to inner chest with deep breathing 3/10. Continue to talk throughout explanation of pain symptoms. One time additional IV dilaudid ordered by P.A. Pt. Resting comfortably at this time. Plan: will continue to evaluate pain level  Intervention: Maintain sternal precautions.  Pt. Instructed on sternal precautions and proper use of cough pillow.

## 2014-08-16 NOTE — Anesthesia Preprocedure Evaluation (Addendum)
Anesthesia Evaluation    AIRWAY    Mallampati: II    TM distance: >3 FB  Neck ROM: full  Mouth Opening:full   CARDIOVASCULAR    cardiovascular exam normal, regular and normal       DENTAL    no notable dental hx       PULMONARY    pulmonary exam normal and clear to auscultation     OTHER FINDINGS                      Anesthesia Plan    ASA 4     general                     intravenous induction   Detailed anesthesia plan: general endotracheal  Monitors/Adjuncts: arterial line, PA cath, BIS, CVP, TEE and ANH    Post Op: post-op ventilation and ICU  Post op pain management: per surgeon    informed consent obtained    Plan discussed with CRNA.    ECG reviewed  pertinent labs reviewed             Left Heart Cath Poss PCI   Study Result      PREOPERATIVE DIAGNOSIS AND INDICATION FOR PROCEDURE:    Unstable angina.    POSTOPERATIVE DIAGNOSES:    1.  Multi-vessel coronary artery disease.  2.  Left main disease (90% distal left main stenosis).  3.  99% subtotal occlusion of dominant mid right coronary artery.  4.  Mildly reduced left ventricular systolic function/ejection fraction 45-50%,    inferior > anterior wall hypokinesis.    TITLE OF PROCEDURES:    1.  Right radial artery access.  2.  Left heart catheterization.  3.  Coronary angiography.  4.  Left ventriculography.    HISTORY OF PRESENT ILLNESS:    Jimmy Cardenas is a 48 year old gentleman with medical history notable for  hypertension, hyperlipidemia, and progressively worsening chest pain and  shortness of breath concerning for unstable angina with anterior wall  ischemia and inferior wall hypokinesis, as well as mildly reduced left  ventricular systolic function on myocardial perfusion imaging, referred for  left heart catheterization, coronary angiography and possible percutaneous  coronary intervention.    DESCRIPTION OF PROCEDURE:    Informed consent was obtained, signed, witnessed, after discussion of  risks, benefits, and alternatives.  The patient was prepped  and draped in  usual sterile fashion.  The right radial artery access was obtained under  moderate sedation with Versed and fentanyl and local anesthesia with  lidocaine using a 6-French sheath.  The aortic valve was crossed with a  pigtail catheter.  Left ventriculogram was performed.  AO and EDP pressures  were obtained.  The left and right coronary arteries was injected using a  TIG catheter.  All equipment was removed at the conclusion of the case.    The right radial artery sheath was removed and a Vasc Band applied with  patent hemostasis.    SUMMARY:    HEMODYNAMICS:  LV 158/0/39.  AO 141/87/114.    LEFT VENTRICULOGRAPHY:  Mildly reduced left ventricular systolic function.  Estimated ejection  fraction 45% to 50%.  Inferior wall severe hypokinesis.  Anterior wall mild  hypokinesis.    CORONARY ANGIOGRAPHY:    Right dominant coronary artery circulation.    Left main:  Distal vessel 90% stenosis.    LAD:  Mild diffuse disease.    Left circumflex:  Nondominant.  Mild diffuse  disease.    RCA:  Dominant.  Mid-vessel 99% subtotal occlusion with TIMI-1 flow.    Distal vessel probable moderate disease (underfilled due to more proximal  disease).    COMPLICATIONS:    None.    SUMMARY:    1.  Multi-vessel coronary artery disease.  2.  Left main disease (90% distal left main stenosis).  3.  99% subtotal occlusion of dominant mid right coronary artery.  4.  Probable moderate diffuse disease of distal right coronary artery  (underfilled on angiography due to more proximal stenosis).  5.  Mildly reduced left ventricular systolic function, Estimated ejection  fraction 45-50%, inferior and anterior wall hypokinesis.    PLAN:    1.  CVTSA consultation for coronary artery bypass grafting this admission.  2.  Vasc Band off in 2 hours.  3.  Admit to the hospital.  4.  Aspirin, statin, beta blocker, nitroglycerin paste.  5.  Echocardiogram and carotid duplex preoperatively.  6.  Plan discussed with patient.         Transthoracic  Echocardiogram  2D, M-mode, Doppler, and Color Doppler  Study date:  14-Aug-2014     Patient: Jimmy Cardenas  MR #: [id number]  Account #: [id number]  DOB: 03-16-1966  Age: 48 years  Gender: Male  Height: 66 in  Weight: 186.6 lb  BSA: 1.94 m  BP: 148/ 89     Allergies: NO KNOWN ALLERGIES     Sonographer:  Phillips Grout, RDCS  Cardiologist:  Sylvan Cheese, MD     CLINICAL QUESTION: pre-op CABG     HISTORY: PRIOR HISTORY: chest pain, CAD     PROCEDURE: The procedure was performed at the bedside. This was a routine study. The transthoracic approach was used.  The study included complete 2D imaging, M-mode, complete spectral Doppler, and color Doppler. Image quality was  adequate.     SYSTEM MEASUREMENT TABLES     2D  IVSd: 1.5 cm  LA Diam: 3.4 cm  LAESV A-L A2C: 38.5 ml  LAESV A-L A4C: 34.8 ml  LAESV Index (A-L): 19.4 ml/m2  LAESV MOD A2C: 36.7 ml  LAESV MOD A4C: 30.5 ml  LAESV(A-L): 37.9 ml  LVIDd: 3.7 cm  LVIDs: 2.7 cm  LVPWd: 1.1 cm  %FS: 27.6 %  Ao Diam: 3.4 cm  EF Biplane: 51.5 %  EF(Teich): 54.3 %     CW  AV DI: 0.8  AV VTI: 18.4 cm  AV Vmax: 107.7 cm/s  AV Vmean: 77.4 cm/s  AV maxPG: 4.6 mmHg  AV meanPG: 2.6 mmHg  PV Vmax: 98.3 cm/s     MM  AV Cusp: 2 cm     PW  AVA I: 1.5  LVOT VTI: 17.1 cm  LVOT Vmax: 90.2 cm/s  LVOT maxPG: 3.3 mmHg  LVOT meanPG: 1.7 mmHg  E': 0.1 m/s  E/E': 7  MV A Vel: 69.1 cm/s  MV DecT: 194.9 ms  MV E Vel: 57.1 cm/s  MV PHT: 56.5 ms  MVA By PHT: 3.9 cm2  AVA (VTI): 3 cm2  AVA Vmax: 2.7 cm2     LEFT VENTRICLE: Size was normal. Ejection fraction was estimated in the range of 50 % to 55 %. There were no regional  wall motion abnormalities. There was hypokinesis of the mid inferior wall(s). There was hypokinesis of the mid-apical  anterior wall(s). Wall thickness was mildly increased. Doppler: Left ventricular diastolic dysfunction grade 1     VENTRICULAR SEPTUM: Thickness was mildly increased.     AORTIC  VALVE: The valve was trileaflet.     AORTA: The root exhibited normal size.     MITRAL  VALVE: There was mild thickening. Doppler: There was trivial regurgitation.     LEFT ATRIUM: Size was normal.     RIGHT VENTRICLE: The size was normal. Systolic function was normal. Wall thickness was normal. Doppler: Systolic  pressure was within the normal range.     PULMONIC VALVE: Doppler: There was trivial regurgitation.     TRICUSPID VALVE: Doppler: There was mild regurgitation.     RIGHT ATRIUM: Size was normal.     SYSTEMIC VEINS: IVC: The inferior vena cava was normal in size and course. Respirophasic changes were normal.     PERICARDIUM: There was no pericardial effusion. The pericardium was normal in appearance.     IMPRESSIONS:  Findings suggest multiple vessel coronary artery disease.     COMPARISONS:  The previous study was not available for direct comparison.     Prepared and signed by     Sylvan Cheese, MD  Signed 14-Aug-2014 23:16:51  ===============================================================  Inpatient Anesthesia Evaluation    Patient Name: Jimmy Cardenas,Jimmy Cardenas  Surgeon: Ad, Alvira Philips, MD  Patient Age / Sex: 48 y.o. / male    Medical History:     Past Medical History   Diagnosis Date   . Shortness of breath    . Dyspnea on exertion    . Hypertension    . Hyperlipidemia    . Cardiomyopathy    . Angina pectoris        Past Surgical History   Procedure Laterality Date   . Back surgery  2010     Lower         Allergies:   No Known Allergies      Medications:     Current Facility-Administered Medications   Medication Dose Route Frequency Last Rate Last Dose   . [MAR Hold] acetaminophen (TYLENOL) tablet 650 mg  650 mg Oral Q4H PRN       . amiodarone (PACERONE) tablet 400 mg  400 mg Oral Q8H SCH   400 mg at 08/16/14 0519   . [MAR Hold] aspirin EC tablet 81 mg  81 mg Oral Daily   81 mg at 08/15/14 0933   . [MAR Hold] atorvastatin (LIPITOR) tablet 80 mg  80 mg Oral QHS   80 mg at 08/15/14 2244   . cefuroxime (ZINACEF) 1.5 g in sodium chloride 0.9 % 100 mL IVPB mini-bag plus  1.5 g Intravenous 60 Min Pre-Op       .  chlorhexidine (PERIDEX) 0.12 % solution           . [MAR Hold] metoprolol (LOPRESSOR) tablet 25 mg  25 mg Oral Q12H SCH   25 mg at 08/15/14 2244   . [MAR Hold] nitroglycerin (NITRO-BID) 2 % ointment 0.5 inch  0.5 inch Topical Q6H HOLD MN   0.5 inch at 08/15/14 1803              Prior to Admission medications    Medication Sig Start Date End Date Taking? Authorizing Provider   aspirin EC 81 MG EC tablet Take 81 mg by mouth daily.   Yes [provider]   atorvastatin (LIPITOR) 40 MG tablet Take 40 mg by mouth every evening.   Yes [provider]   isosorbide mononitrate (IMDUR) 30 MG 24 hr tablet Take 30 mg by mouth daily.   Yes [provider]   metoprolol (LOPRESSOR) 25 MG tablet Take  25 mg by mouth 2 (two) times daily.   Yes [provider]     Vitals   Temp:  [35.6 C (96.1 F)-36.9 C (98.4 F)] 36.4 C (97.5 F)  Heart Rate:  [74-89] 76  Resp Rate:  [16-19] 16  BP: (118-141)/(74-87) 132/81 mmHg    Wt Readings from Last 3 Encounters:   08/15/14 84.097 kg (185 lb 6.4 oz)     BMI (Estimated body mass index is 29.94 kg/(m^2) as calculated from the following:    Height as of this encounter: 1.676 m (5\' 6" ).    Weight as of this encounter: 84.097 kg (185 lb 6.4 oz).)  Temp Readings from Last 3 Encounters:   08/16/14 36.4 C (97.5 F) Oral     BP Readings from Last 3 Encounters:   08/16/14 132/81     Pulse Readings from Last 3 Encounters:   08/16/14 76           Labs:   CBC:  Lab Results   Component Value Date    WBC 8.92 08/15/2014    HGB 14.9 08/15/2014    HCT 45.1 08/15/2014    PLT 283 08/15/2014       Chemistries:  Lab Results   Component Value Date    NA 137 08/15/2014    K 3.7 08/15/2014    CL 104 08/15/2014    CO2 24 08/15/2014    BUN 19.0 08/15/2014    CREAT 1.0 08/15/2014    GLU 102* 08/15/2014    HGBA1C 5.3 08/15/2014    CA 9.3 08/15/2014    AST 16 08/15/2014       Coags:  Lab Results   Component Value Date    PT 13.1 08/15/2014    PTT 33 08/15/2014    INR 1.0 08/15/2014      _____________________      Signed by: Halina Andreas  08/16/2014   6:11 AM    =============================================================

## 2014-08-17 ENCOUNTER — Inpatient Hospital Stay: Payer: Commercial Managed Care - POS

## 2014-08-17 LAB — BASIC METABOLIC PANEL
BUN: 17 mg/dL (ref 9.0–28.0)
CO2: 23 mEq/L (ref 22–29)
Calcium: 8.3 mg/dL — ABNORMAL LOW (ref 8.5–10.5)
Chloride: 110 mEq/L (ref 100–111)
Creatinine: 0.9 mg/dL (ref 0.7–1.3)
Glucose: 101 mg/dL — ABNORMAL HIGH (ref 70–100)
Potassium: 4.5 mEq/L (ref 3.5–5.1)
Sodium: 140 mEq/L (ref 136–145)

## 2014-08-17 LAB — GLUCOSE WHOLE BLOOD - POCT
Whole Blood Glucose POCT: 102 mg/dL — ABNORMAL HIGH (ref 70–100)
Whole Blood Glucose POCT: 107 mg/dL — ABNORMAL HIGH (ref 70–100)
Whole Blood Glucose POCT: 107 mg/dL — ABNORMAL HIGH (ref 70–100)
Whole Blood Glucose POCT: 108 mg/dL — ABNORMAL HIGH (ref 70–100)
Whole Blood Glucose POCT: 118 mg/dL — ABNORMAL HIGH (ref 70–100)
Whole Blood Glucose POCT: 118 mg/dL — ABNORMAL HIGH (ref 70–100)
Whole Blood Glucose POCT: 119 mg/dL — ABNORMAL HIGH (ref 70–100)
Whole Blood Glucose POCT: 120 mg/dL — ABNORMAL HIGH (ref 70–100)
Whole Blood Glucose POCT: 123 mg/dL — ABNORMAL HIGH (ref 70–100)
Whole Blood Glucose POCT: 126 mg/dL — ABNORMAL HIGH (ref 70–100)
Whole Blood Glucose POCT: 127 mg/dL — ABNORMAL HIGH (ref 70–100)
Whole Blood Glucose POCT: 131 mg/dL — ABNORMAL HIGH (ref 70–100)
Whole Blood Glucose POCT: 133 mg/dL — ABNORMAL HIGH (ref 70–100)

## 2014-08-17 LAB — TROPONIN I
Troponin I: 2.3 ng/mL — ABNORMAL HIGH (ref 0.00–0.09)
Troponin I: 2.18 ng/mL — ABNORMAL HIGH (ref 0.00–0.09)

## 2014-08-17 LAB — CBC
Hematocrit: 34.3 % — ABNORMAL LOW (ref 42.0–52.0)
Hgb: 11.1 g/dL — ABNORMAL LOW (ref 13.0–17.0)
MCH: 29 pg (ref 28.0–32.0)
MCHC: 32.4 g/dL (ref 32.0–36.0)
MCV: 89.6 fL (ref 80.0–100.0)
MPV: 10.5 fL (ref 9.4–12.3)
Nucleated RBC: 0 /100 WBC (ref 0–1)
Platelets: 195 10*3/uL (ref 140–400)
RBC: 3.83 10*6/uL — ABNORMAL LOW (ref 4.70–6.00)
RDW: 13 % (ref 12–15)
WBC: 18.81 10*3/uL — ABNORMAL HIGH (ref 3.50–10.80)

## 2014-08-17 LAB — MAGNESIUM: Magnesium: 2.5 mg/dL (ref 1.6–2.6)

## 2014-08-17 LAB — GFR: EGFR: >60

## 2014-08-17 MED ORDER — DEXTROSE 50 % IV SOLN
25.00 mL | INTRAVENOUS | Status: DC | PRN
Start: 2014-08-17 — End: 2014-08-20

## 2014-08-17 MED ORDER — METOPROLOL TARTRATE 25 MG PO TABS
12.5000 mg | ORAL_TABLET | Freq: Two times a day (BID) | ORAL | Status: DC
Start: 2014-08-17 — End: 2014-08-17
  Administered 2014-08-17: 12.5 mg via ORAL
  Filled 2014-08-17: qty 1

## 2014-08-17 MED ORDER — INSULIN ASPART 100 UNIT/ML SC SOLN
1.00 [IU] | Freq: Three times a day (TID) | SUBCUTANEOUS | Status: DC | PRN
Start: 2014-08-17 — End: 2014-08-20

## 2014-08-17 MED ORDER — ATORVASTATIN CALCIUM 10 MG PO TABS
10.0000 mg | ORAL_TABLET | Freq: Every evening | ORAL | Status: DC
Start: 2014-08-17 — End: 2014-08-20
  Administered 2014-08-17 – 2014-08-20 (×4): 10 mg via ORAL
  Filled 2014-08-17 (×4): qty 1

## 2014-08-17 MED ORDER — HYDROMORPHONE HCL 1 MG/ML IJ SOLN
0.50 mg | INTRAMUSCULAR | Status: AC | PRN
Start: 2014-08-17 — End: 2014-08-18
  Administered 2014-08-17: 0.5 mg via INTRAVENOUS
  Filled 2014-08-17: qty 1

## 2014-08-17 MED ORDER — GLUCAGON 1 MG IJ SOLR (WRAP)
1.00 mg | INTRAMUSCULAR | Status: DC | PRN
Start: 2014-08-17 — End: 2014-08-20

## 2014-08-17 MED ORDER — METOPROLOL TARTRATE 25 MG PO TABS
25.0000 mg | ORAL_TABLET | Freq: Two times a day (BID) | ORAL | Status: DC
Start: 2014-08-17 — End: 2014-08-18
  Administered 2014-08-17: 25 mg via ORAL
  Filled 2014-08-17: qty 1

## 2014-08-17 NOTE — Plan of Care (Signed)
[facility] patient transferred from CVICU during shift.  VSS, BP in 110s, NSR in 80s, pacer wires attached to pacer which is turned off.  Patient sating mid 90s on RA.  Four chest tubes (2 pleural, 2 mediastinall) in place, suction to -20, draining serosanguinous fluid.  Patient on full liquid diet.  [place]'d foley cathether, removed IJ central line and patient tolerated both procedures well.  Patient in bed.  Troponin labs drawn, waiting for results.  Patient c/o moderate pain and treated with dilaudid, tramadol and percocet.

## 2014-08-17 NOTE — Anesthesia Postprocedure Evaluation (Signed)
Anesthesia Post Evaluation    Patient: Jimmy Cardenas    Procedures performed: Procedure(s) with comments:  CORONARY ARTERY BYPASS x 3 - [place]  Cardioplegia.   LIMA to LAD  RIMA  to OM (T graft off of LIMA)  LSVG to RPDA     ENDOSCOPIC,VEIN HARVEST - LSVG harvested by M. Christopher PA, leg incision at 08:00, out of leg at 08:50, preparation finished at 09:05 .  Harvested from groin to knee, closed by M. Cristal Deer PA  TEE - probe # [id number], Performed by Dr. Alesia Richards    Anesthesia type: General ETT    Patient location: Telemetry/Step Down Unit    Post pain: Patient not complaining of pain, continue current therapy     Post assessment: no apparent anesthetic complications, no reportable events and no evidence of recall    Last vitals:   Filed Vitals:    08/17/14 0456   BP: 106/58   Pulse: 80   Temp: 37 C (98.6 F)   Resp: 18   SpO2: 95%       Post vital signs: stable    Level of consciousness: awake and alert     Respiratory: tolerating nasal cannula    Cardiovascular: stable    Other complications: none

## 2014-08-17 NOTE — Plan of Care (Signed)
Problem: Pain  Goal: Patient's pain/discomfort is manageable  Outcome: Progressing    Pt AxOx3, VSS on room air.  SR 70-80s.  Pacer attached but off.  Chest tubes x4, to two atriums, both to suction, no air leak.  Incision midsternal and left leg covered with dressing, c/d/i.  Pt reported pain 2/10, "pain tolerable".  Pain management education given to pt, but pt prefers to take Tylenol only.  O2 sat stable on room air.  Pt uses IS often and up to 1000.  Lung sounds clear but diminished at bases.  Tolerated diet.  Insulin d/c per order.  Voided without difficulty post foley removed.  Ambulated in hall several times.  Worked with PT/OT.  Pt is in the research group, nurse Ronnell Freshwater made aware of the troponin results.  No family at bedside.  SCD, floor mat and chair alarm on for safety.  Continue cardiac surgery pathway.

## 2014-08-17 NOTE — Progress Notes (Addendum)
CARDIAC SURGERY CVSD/CTU PROGRESS NOTE    POD: 1    SURGERY: CABG x 3    EF: 45-50%    SURGEON: Ad, Niv, MD    ASSESSMENT AND PLAN:    NEURO:   Intact   OOB-chair.    CARDIAC:   SR- amio.   HTN- lopressor.   Pacing wires: intact.   Core Measures: Beta blocker: Lopressor  Statin: Lipitor  ACE: no- lower BP  ASA: 325 mg    PULMONARY:   RA, good sats.   Chest tubes:  Decreasing drainge.   Chest xray: Small left eff/atelectasis        GI:   Start diet   Bowel protocol    RENAL:   Creat- 0.9   Good urine output.      HEME:   Hct- pending   ASA   DVT Prophylaxis:  SCD's.     ID:   Afebrile.    Cultures: n/a.     ENDO:    Insulin GTT protocol- good control      Plan:   Wean O2   Lopressor   OOB               DISPOSITION:     Home at D/C, with his sister.         SUBJECTIVE:     Feels ok    OBJECTIVE:    VITALS LAST 24 HOURS:  Temp:  [97.7 F (36.5 C)-98.7 F (37.1 C)] 98.6 F (37 C)  Heart Rate:  [60-90] 75  Resp Rate:  [8-30] 18  BP: (102-127)/(56-78) 121/68 mmHg  Arterial Line BP: (105-128)/(57-70) 115/66 mmHg  FiO2:  [40 %-100 %] 40 %  O2 saturation: 95% on RA  Rhythm: SR    PHYSICAL EXAM:     Neuro: Awake, alert   Cardiac: Reg rhythm   Lungs: decreased at bases.    Abdomen: Soft, + BS   Extremities: Warm, some edema   Incisions: Intact/dressed.     RECENT LABS:    CBC:  Recent Labs      08/16/14   1251  08/15/14   0104   WBC  11.67*  8.92   HEMOGLOBIN  11.7*  14.9   HEMATOCRIT  35.3*  45.1       BMP:  Recent Labs      08/17/14   0317  08/16/14   1921  08/16/14   1549  08/16/14   1251  08/15/14   0104   SODIUM  140   --    --   138  137   POTASSIUM  4.5  4.7  3.7  4.6  3.7   CHLORIDE  110   --    --   110  104   CO2  23   --    --   23  24   BUN  17.0   --    --   16.0  19.0   CREATININE  0.9   --    --   0.9  1.0   GLUCOSE  101*   --    --   122*  102*   MAGNESIUM  2.5   --   2.7*  3.0*   --    CALCIUM  8.3*   --    --   8.2*  9.3       LFTs:  Recent Labs      08/15/14   0104   AST  (SGOT)  16   ALT  23   ALKALINE PHOSPHATASE  81   BILIRUBIN, TOTAL  0.4   PROTEIN, TOTAL  7.6   ALBUMIN  3.8       INR:  Recent Labs      08/16/14   1251  08/15/14   0104   PT  16.0*  13.1   PT INR  1.3*  1.0         MEDICATIONS:    Current Facility-Administered Medications   Medication Dose Route Frequency   . amiodarone  200 mg Oral Q12H SCH   . aspirin  325 mg Oral Daily   . atorvastatin  10 mg Oral QPM   . famotidine  20 mg Oral Q12H SCH   . metoprolol  25 mg Oral BID   . mupirocin   Nasal Q12H SCH   . polyethylene glycol  17 g Oral Daily   . senna-docusate  1 tablet Oral QHS         INTAKE/OUTPUT:     Intake and Output Summary (Last 24 hours) at Date Time        Intake/Output Summary (Last 24 hours) at 08/17/14 1203  Last data filed at 08/17/14 0600   Gross per 24 hour   Intake 3567.4 ml   Output   3152 ml   Net  415.4 ml

## 2014-08-17 NOTE — PT Eval Note (Signed)
[facility]   Physical Therapy Evaluation   Patient: Jimmy Cardenas    MRN#: [id number]   [facility]  Bed: FI260/FI260-01    Discharge Recommendations:   Discharge Recommendation: Home with supervision (assist prn from family as needed)   DME Recommendation: DME Recommended for Discharge:  (none)     Assessment:   Jimmy Cardenas is a 48 y.o. male admitted 08/14/2014.  Pt presents with CABG x 3. Pt given sternal handout by OT; pt able to verbalize and follow precautions. SBA for transfers and ambulation. Vitals stable with activity. Would benefit from continued PT to maximize safety, functional abilities, and independence.      Impairments: Assessment: Decreased endurance/activity tolerance;Decreased functional mobility;Decreased balance;Gait impairment.     Therapy Diagnosis: impaired functional mobility     Rehabilitation Potential: Prognosis: Good;With continued PT status post acute discharge    Treatment Activities: evaluation, gait training, sternal precautions  Educated the patient to role of physical therapy, plan of care, goals of therapy and safety with mobility and ADLs, sternal precautions, home safety.    Plan:   Treatment/Interventions: Investment banker, operational, Functional transfer training, Patient/family training     PT Frequency: 3-4x/wk   Risks/Benefits/POC Discussed with Pt/Family: With patient        Precautions and Contraindications:   Weight Bearing Status:  (sternal prec)  Sternal Precautions: no raising elbows higher than shoulders, no reaching behind back, no lifting, no pushing, no pulling  Other Precautions:  (falls, sternal precautions)    Consult received for Jimmy Cardenas for PT Evaluation and Treatment.  Patient's medical condition is appropriate for Physical therapy intervention at this time.    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 male admitted on 08/14/2014 with "CARDIAC SURGERY EVALUATION IS REQUESTED FOR THIS 48 YEAR OLD MALE  WITH PROGRESSIVELY WORSENING DYSPNEA ON EXERTION AND CHEST  DISCOMFORT. MYOCARDIAL PERFUSION STUDY DONE BY HIS PCP SHOWED  INFERIOR WALL ISCHEMIA AND MILDLY REDUCED SYSTOLIC FUNCTION AND HE  WAS SENT FOR CARDIOLOGY EVALUATION. CARDIAC CATH WAS DONE ON  08/14/14 BY DR. Idolina Primer AND REVEALED 90% DISTAL LEFT MAIN DISEASE  AND SUBTOTAL MID RCA OCCLUSION WITH AND EF OF 45- 50%. HE IS  REFERRED FOR CABG.  CARDIAC CATH 08/14/14: LEFT MAIN- 90% DISTAL OCCLUSION, MID RCA-  SUBTOTAL OCCLUSION, EF 45-50%" per MD note.    Past Medical/Surgical History:  SOB, DOW, htn, cardiomyopahty     X-Rays/Tests/Labs:  X-ray chest AP portable   Portable AP radiographic examination of the chest shows  grossly unchanged atelectasis in the lung bases following extubation.  The remaining lines and tubes are in satisfactory positions.      Social History:   Prior Level of Function:  Prior level of function: Independent with ADLs, Ambulates independently  Baseline Activity Level: Community ambulation  DME Currently at Home:  (none)    Home Living Arrangements:  Living Arrangements: Alone (sister staying with pt upon [place])  Type of Home: Apartment  Home Layout: One level (first floor; 1 step to enter)  DME Currently at Home:  (none)  Home Living - Notes / Comments:  (1 step to enter)    Subjective: I want to walk again  Patient is agreeable to participation in the therapy session. Nursing clears patient for therapy.     Patient Goal:  (to go home)    Pain Assessment  Pain Assessment: Numeric Scale (0-10)  Pain Score: 1-mild pain ("not really pain" )  Pain Location:  (surgical area)  Pain Intervention(s):  (rn aware, positioning, rest)    Objective:   Observation of Patient/Vital Signs:   Patient is seated in cardiac chair with iv, 2 CTs to suction, pacer grounded, telemetry in place.    Observation of Patient/Vital signs:  Inspection/Posture:  (pt sitting in cardiac chair upon entry)    Musculoskeletal Examination:  Gross ROM  Right Upper Extremity ROM: within functional limits (within sternal prec)  Left Upper Extremity ROM: within functional limits (within sternal precautions)  Right Lower Extremity ROM: within functional limits  Left Lower Extremity ROM: within functional limits    Gross Strength  Right Upper Extremity Strength: within functional limits (within sternal prec)  Left Upper Extremity Strength: within functional limits (within sternal prec)  Right Lower Extremity Strength: within functional limits  Left Lower Extremity Strength: within functional limits    Functional Mobility:  Supine to Sit:  (NT: pt sitting in chair)  Sit to Stand: Stand by Assist  Stand to Sit: Stand by Assist    Ambulation:  Ambulation: Stand by Assist  Ambulation Distance (Feet):  (120 feet)  Pattern:  (dec cadence, dec step length)  Stair Management:  (nt this visit)     Balance:  Balance: within functional limits (SBA with no ad)  Participation and Activity Tolerance:  Participation Effort: good  Endurance:  (fair)  O2 in upper 90s post ambulation on RA, BP and HR stable    Patient left with call bell within reach, all needs met, SCDs not on, pt working with tech and all questions answered. RN notified of session outcome and patient response.     Goals:   Goals  Goal Formulation: With patient  Time for Goal Acheivement: 3 visits  Goals: Select goal  Pt Will Go Supine To Sit: modified independent (sternal prec)  Pt Will Perform Sit To Supine: modified independent (sternal prec)  Pt Will Ambulate: > 200 feet, independent (no ad)  Pt Will Go Up / Down Stairs:  (1 step with sba with no AD)       Marcie Bal, PT DPT 08/17/2014 5:25 PM    Time of treatment:   PT Received On: 08/17/14  Start Time: 1150  Stop  Time: 1225  Time Calculation (min): 35 min

## 2014-08-17 NOTE — OT Eval Note (Signed)
[facility]   Occupational Therapy Evaluation     Patient: Jimmy Cardenas    MRN#: [id number]   [facility]  Bed: FI260/FI260-01                                     Discharge Recommendations:   Discharge Recommendation: Home with supervision/family assist as needed   DME Recommended for Discharge:  None    Assessment:   Jimmy Cardenas is a 48 y.o. male admitted 08/14/2014 s/p CABG x 3. Provided pt with sternal precautions handout and educated on performance of ADLs- verbalize and demonstrate understanding. Pt presents with decreased independence with ADLs, decreased functional mobility, and decreased activity tolerance. Would benefit from skilled OT intervention to increase safety and independence with ADLs.     Therapy Diagnosis: decreased independence with ADLs    Rehabilitation Potential: good    Treatment Activities: OT initial eval, education on sternal precautions, transfer training    Educated the patient to role of occupational therapy, plan of care, goals of therapy and safety with mobility and ADLs, energy conservation techniques, pursed lip breathing, sternal precautions, discharge instructions, home safety.    Plan:   OT Frequency Recommended: follow-up visit only     Treatment/Interventions: ADL retraining, transfer training, therex, theract, pt/family education    Risks/benefits/POC discussed yes       Precautions and Contraindications:   Falls  Sternal    Consult received for Jimmy Cardenas for OT Evaluation and Treatment.  Patient's medical condition is appropriate for Occupational Therapy intervention at this time.      History of Present Illness:    Jimmy Cardenas is a 48 y.o. male admitted on 08/14/2014 s/p CABG x 3.    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 The remaining lines and tubes are in satisfactory positions.     Social History:   Prior Level of Function: independent with ADLs/IADLs  Assistive Devices: none  Baseline Activity: Tourist information centre manager, works full time  DME Currently at Home: none  Home Living Arrangements: lives alone, sister will be staying with pt at discharge  Type of Home: apartment  Home Layout: main level, tub/shower    Subjective: "Is it normal to feel weak after surgery?"    Patient is agreeable to participation in the therapy session. Nursing clears patient for therapy.     Patient Goal: to get better    Pain:   Scale: .5/10  Location: chest incision  Intervention: RN aware    Objective:   Patient is seated in a cardiac chair with telemetry, IV, CT x 2 to suction, external pacer, SCDs, chair alarm in place.      Cognitive Status and Neuro Exam:  Alert and oriented. Follows directions.    Musculoskeletal Examination  RUE ROM: WFL within sternal precautions  LUE ROM: WFL within sternal precautions  RLE ROM: WFL  LLE ROM: WFL    RUE Strength: 4/5 within sternal precautions  LUE Strength:  4/5 within sternal precautions  RLE Strength: 4/5  LLE Strength: 4/5      Sensory/Oculomotor Examination  Auditory: intact  Tactile: intact  Vision: intact      Activities of Daily Living  Eating: independent hand to mouth  Grooming: supervision with set up  Bathing: NT  UE Dressing: independent while seated  LE Dressing: min A while seated  Toileting: urinal    Functional Mobility:  Supine to Sit: NT- pt in chair  Sit to Stand: supervision  Transfers: SBA     Balance  Static Sitting: independent  Dynamic Sitting: independent  Static Standing: supervision  Dynamic Standing:  SBA    Participation and Activity Tolerance  Participation Effort: good  Endurance: fair+    Patient left with call bell within reach, all needs met, SCDs in place and all questions answered. RN notified of session outcome and patient response.       Goals:  Time For Goal Achievement: 1 visit  ADL Goals  Patient will groom self: Supervision, at sinkside, 1 visit  Patient will dress lower body: Supervision, 1 visit  Mobility and Transfer Goals  Pt will perform functional transfers: Supervision, 1 visit        Executive Fucntion Goals  Pt will follow sternal precautions: independent, to increase ability to complete ADLs, 1 visit                Jacalyn Lefevre, MS, OTR/L  Pager [phone]    Time of treatment:   OT Received On: 08/17/14  Start Time: 0900  Stop Time: 0930  Time Calculation (min): 30 min

## 2014-08-18 LAB — GLUCOSE WHOLE BLOOD - POCT
Whole Blood Glucose POCT: 116 mg/dL — ABNORMAL HIGH (ref 70–100)
Whole Blood Glucose POCT: 99 mg/dL (ref 70–100)

## 2014-08-18 MED ORDER — HYDROMORPHONE HCL 1 MG/ML IJ SOLN
0.50 mg | Freq: Once | INTRAMUSCULAR | Status: AC
Start: 2014-08-18 — End: 2014-08-18
  Administered 2014-08-18: 0.5 mg via INTRAVENOUS
  Filled 2014-08-18: qty 1

## 2014-08-18 MED ORDER — METOPROLOL TARTRATE 25 MG PO TABS
12.5000 mg | ORAL_TABLET | Freq: Two times a day (BID) | ORAL | Status: DC
Start: 2014-08-18 — End: 2014-08-20
  Administered 2014-08-18 – 2014-08-20 (×5): 12.5 mg via ORAL
  Filled 2014-08-18 (×5): qty 1

## 2014-08-18 MED ORDER — POTASSIUM CHLORIDE CRYS ER 10 MEQ PO TBCR
10.00 meq | EXTENDED_RELEASE_TABLET | Freq: Every day | ORAL | Status: DC
Start: 2014-08-18 — End: 2014-08-20
  Administered 2014-08-18 – 2014-08-20 (×3): 10 meq via ORAL
  Filled 2014-08-18 (×3): qty 1

## 2014-08-18 MED ORDER — FUROSEMIDE 10 MG/ML IJ SOLN
20.00 mg | Freq: Every day | INTRAMUSCULAR | Status: DC
Start: 2014-08-18 — End: 2014-08-20
  Administered 2014-08-18 – 2014-08-20 (×3): 20 mg via INTRAVENOUS
  Filled 2014-08-18 (×3): qty 4

## 2014-08-18 NOTE — PT Progress Note (Signed)
Physical Therapy Note    [facility]   Physical Therapy Treatment  Patient:  Jimmy Cardenas MRN#:  [id number]  [facility]  Bed: FI260/FI260-01    Discharge Recommendations:   D/C Recommendations: Home with supervision (Family assist PRN)   DME Recommendations: None        Assessment:   Pt progressing well with PT , requires few rest breaks 2/2 low activity tolerance. Pt continues to benefit from PT to maximize independence with functional ambulation and mobility.     Treatment Activities: gait training, functional transfers    Educated the patient to role of physical therapy, plan of care, goals of therapy and safety with mobility and ADLs, sternal precautions, home safety.    Plan:   PT Frequency: 3-4x/wk    Continue plan of care.       Precautions and Contraindications:   Falls, Sternal    Updated Medical Status/Imaging/Labs: chart reviewed    Subjective:      Patient's medical condition is appropriate for Physical Therapy intervention at this time.  Patient is agreeable to participation in the therapy session. Nursing clears patient for therapy.    Pain:   Scale: Denies pain, some discomfort on incision      Objective:   Patient is in bed with telemetry and peripheral IV, chest tube in place.    Cognition  Alert and appropriate    Functional Mobility  Rolling: SBA  Supine to Sit: SBA with HOB 10 degress  Scooting: Sup  Sit to Stand: Sup  Stand to Sit: Sup  Transfers: SBA    Ambulation  Level of Assistance required: SBA  Ambulation Distance: 300 ft  Pattern: slow gait otherwise WFL  Device Used: None  Weightbearing Status: FWB B/L LE    Balance  Static Sitting: good  Dynamic Sitting: good  Static Standing: good-  Dynamic Standing: good-    Therapeutic Exercises  Incorporated in PT session. Revised HEP    Patient Participation: good  Patient Endurance: good-    Patient left with call bell within reach, all needs met, SCDs donned and all questions answered. RN notified of session  outcome and patient response.     Goals:  Goals  Goal Formulation: With patient  Time for Goal Acheivement: 3 visits  Goals: Select goal  Pt Will Go Supine To Sit: modified independent (sternal prec)  Pt Will Perform Sit To Supine: modified independent (sternal prec)  Pt Will Ambulate: > 200 feet, independent (no ad)  Pt Will Go Up / Down Stairs:  (1 step with sba with no AD)      Carolan Shiver, PT Pager [phone]      Time of Treatment:  PT Received On: 08/18/14  Start Time: 0840  Stop Time: 0904  Time Calculation (min): 24 min  Treatment # 1 out of 3 visits

## 2014-08-18 NOTE — Progress Notes (Signed)
Pt.AOx3,SR on tele,vss,given percocet as requested for incision pain with good relief,ambulated on the hall way before bedtime-standby assist,incisions OTA.passing gas,no BM yet,labs in am.Will continue plan of care.

## 2014-08-18 NOTE — Progress Notes (Signed)
CARDIAC SURGERY CVSD/CTU PROGRESS NOTE    POD: 2    SURGERY: CABG x 3    EF: 45-50%    SURGEON: Ad, Niv, MD    ASSESSMENT AND PLAN:    NEURO:   Intact   OOB-chair.    CARDIAC:   SR- amio.   HTN- lopressor (low dose). stable   Pacing wires: intact.   Core Measures: Beta blocker: Lopressor  Statin: Lipitor  ACE: no- lower BP  ASA: 325 mg    PULMONARY:   RA, good sats.   Chest tubes:  Decreasing drainge.   Chest xray: Small left eff/atelectasis        GI:   Tolerating po.   Bowel protocol    RENAL:   Diurese.      HEME:   Hct- 34.3   ASA   DVT Prophylaxis:  SCD's.     ID:   Afebrile.    Cultures: n/a.     ENDO:    Insulin GTT protocol- good control      Plan:   Ambulate   Lasix   Remove ctubes wires today/tomorrow            DISPOSITION:     Home at D/C, with his sister.         SUBJECTIVE:     Feels ok    OBJECTIVE:    VITALS LAST 24 HOURS:  Temp:  [98.1 F (36.7 C)-99.4 F (37.4 C)] 98.1 F (36.7 C)  Heart Rate:  [75-87] 87  Resp Rate:  [16-18] 18  BP: (102-121)/(68-74) 114/73 mmHg  O2 saturation: 95% on RA  Rhythm: SR    PHYSICAL EXAM:     Neuro: Awake, alert   Cardiac: Reg rhythm   Lungs: decreased at bases.    Abdomen: Soft, + BS   Extremities: Warm, some edema   Incisions: Intact/dressed.     RECENT LABS:    CBC:  Recent Labs      08/17/14   1459  08/16/14   1251   WBC  18.81*  11.67*   HEMOGLOBIN  11.1*  11.7*   HEMATOCRIT  34.3*  35.3*       BMP:  Recent Labs      08/17/14   0317  08/16/14   1921  08/16/14   1549  08/16/14   1251   SODIUM  140   --    --   138   POTASSIUM  4.5  4.7  3.7  4.6   CHLORIDE  110   --    --   110   CO2  23   --    --   23   BUN  17.0   --    --   16.0   CREATININE  0.9   --    --   0.9   GLUCOSE  101*   --    --   122*   MAGNESIUM  2.5   --   2.7*  3.0*   CALCIUM  8.3*   --    --   8.2*       LFTs:  No results for input(s): AST, ALT, ALKPHOS, BILITOTAL, BILIDIRECT, BILIINDIRECT, PROT, ALB in the last 72 hours.    INR:  Recent Labs      08/16/14   1251    PT  16.0*   PT INR  1.3*         MEDICATIONS:    Current Facility-Administered Medications  Medication Dose Route Frequency   . amiodarone  200 mg Oral Q12H SCH   . aspirin  325 mg Oral Daily   . atorvastatin  10 mg Oral QPM   . famotidine  20 mg Oral Q12H SCH   . furosemide  20 mg Intravenous Daily   . metoprolol  12.5 mg Oral BID   . mupirocin   Nasal Q12H SCH   . polyethylene glycol  17 g Oral Daily   . potassium chloride  10 mEq Oral Daily   . senna-docusate  1 tablet Oral QHS         INTAKE/OUTPUT:     Intake and Output Summary (Last 24 hours) at Date Time          Intake/Output Summary (Last 24 hours) at 08/18/14 1045  Last data filed at 08/18/14 0600   Gross per 24 hour   Intake   1500 ml   Output   1965 ml   Net   -465 ml

## 2014-08-18 NOTE — Plan of Care (Signed)
Patient is alert/orient x4. Vitals stable. SR on telemetry. O2 sats 95% on RA. Chest tubes and pacing wires removed by PA. Patient tolerated well. Midsternal incision clean, dry, and intact.  Dressings removed. Pain controlled on PRN tramadol and percocet. Ambulated 2 laps in hallway with stand-by assist. Received 1 dose 0.5mg  IV dilaudid for increased pain post walk.Reinfoced incentive spirometer use and sternal precautions. Safety and fall precautions maintained. Will continue to monitor and notify PA of changes in status.

## 2014-08-18 NOTE — Plan of Care (Signed)
Pt AxOx3, VSS on room air. SR 70-80s. Pacer wires grounded. Chest tubes x4, to two atriums, both to suction, no air leak. Moderate pain treated with percocet.  Pt uses IS often and ambulated in hallway once with one assist. Lung sounds clear but diminished at bases. Pt voided regularly but no BM during shift. SCDs on, floor mat and chair alarm on for safety.

## 2014-08-19 ENCOUNTER — Inpatient Hospital Stay: Payer: Commercial Managed Care - POS

## 2014-08-19 LAB — CBC AND DIFFERENTIAL
Basophils Absolute Automated: 0 10*3/uL (ref 0.00–0.20)
Basophils Automated: 0 %
Eosinophils Absolute Automated: 0.03 10*3/uL (ref 0.00–0.70)
Eosinophils Automated: 0 %
Hematocrit: 34.7 % — ABNORMAL LOW (ref 42.0–52.0)
Hgb: 11.3 g/dL — ABNORMAL LOW (ref 13.0–17.0)
Immature Granulocytes Absolute: 0.02 10*3/uL
Immature Granulocytes: 0 %
Lymphocytes Absolute Automated: 2.23 10*3/uL (ref 0.50–4.40)
Lymphocytes Automated: 17 %
MCH: 29.2 pg (ref 28.0–32.0)
MCHC: 32.6 g/dL (ref 32.0–36.0)
MCV: 89.7 fL (ref 80.0–100.0)
MPV: 10.7 fL (ref 9.4–12.3)
Monocytes Absolute Automated: 1.06 10*3/uL (ref 0.00–1.20)
Monocytes: 8 %
Neutrophils Absolute: 10.1 10*3/uL — ABNORMAL HIGH (ref 1.80–8.10)
Neutrophils: 75 %
Nucleated RBC: 0 /100 WBC (ref 0–1)
Platelets: 203 10*3/uL (ref 140–400)
RBC: 3.87 10*6/uL — ABNORMAL LOW (ref 4.70–6.00)
RDW: 13 % (ref 12–15)
WBC: 13.44 10*3/uL — ABNORMAL HIGH (ref 3.50–10.80)

## 2014-08-19 LAB — BASIC METABOLIC PANEL
BUN: 18 mg/dL (ref 9.0–28.0)
CO2: 28 mEq/L (ref 22–29)
Calcium: 8.3 mg/dL — ABNORMAL LOW (ref 8.5–10.5)
Chloride: 105 mEq/L (ref 100–111)
Creatinine: 0.8 mg/dL (ref 0.7–1.3)
Glucose: 91 mg/dL (ref 70–100)
Potassium: 4.2 mEq/L (ref 3.5–5.1)
Sodium: 138 mEq/L (ref 136–145)

## 2014-08-19 LAB — GLUCOSE WHOLE BLOOD - POCT: Whole Blood Glucose POCT: 92 mg/dL (ref 70–100)

## 2014-08-19 LAB — GFR: EGFR: >60

## 2014-08-19 LAB — MAGNESIUM: Magnesium: 2.2 mg/dL (ref 1.6–2.6)

## 2014-08-19 NOTE — Progress Notes (Addendum)
Pt.AOx3,SR on tele,vss,given percocet as requested to incision pain with good relief,ambulating independently,voiding freely,MS/LLE incisions OTA,IS while awake,patient for discharge tomorrow-sister will pick him up at 1800-will endorse to day RN.

## 2014-08-19 NOTE — Discharge Summary -  Nursing (Signed)
Spoke via phone with pt's sister and with patient in the room.STERNAL PRECAUTIONS, CARDIAC DIET, FOLLOW UP APPOINTMENTS WITH SURGEON (WITH XRAY)  CARDIOLOGIST, PRIMARY CARE PHYSICIAN, , INCISION CARE, SYMPTOMS OF INFECTION, DAILY WEIGHT IN MORNING, INCENTIVE SPIROMETRY, ACTIVITY RESTRICTIONS, AMBULATION, TEMPERATURE, PULSE AND BLOOD PRESSURE CHECK,   ALSO REVIEWED WITH THE PATIENT WHEN TO SEEK FURTHER TREATMENT.  Discharge nurse will review paper work with pt.

## 2014-08-19 NOTE — Plan of Care (Addendum)
Assumed care of patient at 0730. Patient is alert/orient x4. Vitals stable. NSR in 80s on telemetry. O2 sats 96% on RA. 2 view chest X-ray completed. Adequate urine output. Still needs BM. + bowel sounds and + gas. On bowel regimen. Out of bed and ambulating multiple laps in hallway independently. Midsternal and left leg incision clean, dry, intact and open to air. Pain controlled on PRN tramadol. Showered today and incision care performed. Plan is for discharge tomorrow. Patient states that sister cannot pick patient up until 1800. Played discharge video and discharge education provided by discharge nurse (see previous note). Safety and fall precautions maintained. Will continue to monitor.

## 2014-08-19 NOTE — Progress Notes (Addendum)
CARDIAC SURGERY CVSD/CTU PROGRESS NOTE    POD: 3    SURGERY: CABG x 3    EF: 45-50%    SURGEON: Ad, Niv, MD    ASSESSMENT AND PLAN:    NEURO:   Intact   OOB-chair.    CARDIAC:   SR- amio.   HTN- lopressor (low dose). stable   Pacing wires: intact.   Core Measures: Beta blocker: Lopressor  Statin: Lipitor  ACE: no- lower BP  ASA: 325 mg    PULMONARY:   RA, good sats.   Chest tubes:  Decreasing drainge.   Chest xray: Small left eff/atelectasis        GI:   Tolerating po.   Bowel protocol    RENAL:   Diurese.      HEME:   Hct- 34.7   ASA   DVT Prophylaxis:  SCD's.     ID:   Afebrile.    Cultures: n/a.     ENDO:    Insulin GTT protocol- good control      Plan:   Ambulate   Lasix   Home tomorrow with his sister who is arriving from [place].       DISPOSITION:     Home at D/C, with his sister.         SUBJECTIVE:     Feels ok    OBJECTIVE:    VITALS LAST 24 HOURS:  Temp:  [98.1 F (36.7 C)-99.1 F (37.3 C)] 98.7 F (37.1 C)  Heart Rate:  [73-88] 77  Resp Rate:  [17-19] 18  BP: (103-139)/(64-88) 117/71 mmHg  O2 saturation: 95% on RA  Rhythm: SR    PHYSICAL EXAM:     Neuro: Awake, alert   Cardiac: Reg rhythm   Lungs: decreased at bases.    Abdomen: Soft, + BS   Extremities: Warm, some edema   Incisions: Intact/dressed.     RECENT LABS:    CBC:  Recent Labs      08/19/14   0517  08/17/14   1459  08/16/14   1251   WBC  13.44*  18.81*  11.67*   HEMOGLOBIN  11.3*  11.1*  11.7*   HEMATOCRIT  34.7*  34.3*  35.3*       BMP:  Recent Labs      08/19/14   0517  08/17/14   0317  08/16/14   1921  08/16/14   1549  08/16/14   1251   SODIUM  138  140   --    --   138   POTASSIUM  4.2  4.5  4.7  3.7  4.6   CHLORIDE  105  110   --    --   110   CO2  28  23   --    --   23   BUN  18.0  17.0   --    --   16.0   CREATININE  0.8  0.9   --    --   0.9   GLUCOSE  91  101*   --    --   122*   MAGNESIUM  2.2  2.5   --   2.7*  3.0*   CALCIUM  8.3*  8.3*   --    --   8.2*       LFTs:  No results for input(s): AST,  ALT, ALKPHOS, BILITOTAL, BILIDIRECT, BILIINDIRECT, PROT, ALB in the last 72 hours.    INR:  Recent  Labs      08/16/14   1251   PT  16.0*   PT INR  1.3*         MEDICATIONS:    Current Facility-Administered Medications   Medication Dose Route Frequency   . amiodarone  200 mg Oral Q12H SCH   . aspirin  325 mg Oral Daily   . atorvastatin  10 mg Oral QPM   . famotidine  20 mg Oral Q12H SCH   . furosemide  20 mg Intravenous Daily   . metoprolol  12.5 mg Oral BID   . mupirocin   Nasal Q12H SCH   . polyethylene glycol  17 g Oral Daily   . potassium chloride  10 mEq Oral Daily   . senna-docusate  1 tablet Oral QHS         INTAKE/OUTPUT:     Intake and Output Summary (Last 24 hours) at Date Time          Intake/Output Summary (Last 24 hours) at 08/19/14 0941  Last data filed at 08/19/14 0600   Gross per 24 hour   Intake    360 ml   Output   1550 ml   Net  -1190 ml

## 2014-08-20 ENCOUNTER — Other Ambulatory Visit: Payer: Self-pay

## 2014-08-20 MED ORDER — POLYETHYLENE GLYCOL 3350 17 G PO PACK
17.00 g | PACK | Freq: Every day | ORAL | 0 refills | Status: AC
Start: 2014-08-20 — End: ?
  Filled 2014-08-20: qty 14, 14d supply, fill #0

## 2014-08-20 MED ORDER — ATORVASTATIN CALCIUM 10 MG PO TABS
10.00 mg | ORAL_TABLET | Freq: Every evening | ORAL | 0 refills | Status: AC
Start: 2014-08-20 — End: ?
  Filled 2014-08-20: qty 30, 30d supply, fill #0

## 2014-08-20 MED ORDER — ACETAMINOPHEN 325 MG PO TABS
650.00 mg | ORAL_TABLET | ORAL | Status: AC | PRN
Start: 2014-08-20 — End: ?

## 2014-08-20 MED ORDER — METOPROLOL TARTRATE 25 MG PO TABS
12.50 mg | ORAL_TABLET | Freq: Two times a day (BID) | ORAL | 0 refills | Status: AC
Start: 2014-08-20 — End: ?
  Filled 2014-08-20: qty 30, 30d supply, fill #0

## 2014-08-20 MED ORDER — SENNOSIDES-DOCUSATE SODIUM 8.6-50 MG PO TABS
1.00 | ORAL_TABLET | Freq: Every evening | ORAL | Status: AC
Start: 2014-08-20 — End: ?

## 2014-08-20 MED ORDER — AMIODARONE HCL 200 MG PO TABS
ORAL_TABLET | ORAL | 0 refills | Status: AC
Start: 2014-08-20 — End: ?
  Filled 2014-08-20: qty 13, 12d supply, fill #0

## 2014-08-20 MED ORDER — TRAMADOL HCL 50 MG PO TABS
50.00 mg | ORAL_TABLET | ORAL | 0 refills | Status: DC | PRN
Start: 2014-08-20 — End: 2017-12-30
  Filled 2014-08-20: qty 30, 5d supply, fill #0

## 2014-08-20 MED ORDER — OXYCODONE-ACETAMINOPHEN 5-325 MG PO TABS
1.00 | ORAL_TABLET | Freq: Four times a day (QID) | ORAL | 0 refills | Status: DC | PRN
Start: 2014-08-20 — End: 2017-12-30
  Filled 2014-08-20: qty 30, 8d supply, fill #0

## 2014-08-20 MED ORDER — ASPIRIN 325 MG PO TABS
325.00 mg | ORAL_TABLET | Freq: Every day | ORAL | Status: AC
Start: 2014-08-20 — End: ?

## 2014-08-20 NOTE — Discharge Summary -  Nursing (Signed)
D/C instructions reviewed with pt, including medications, follow up appts and instructions, reasons to contact MD or seek further treatment, activities and incision care. Pt verbalized understanding, no further questions at this time, will follow up as instructed. IV with cannula intact & tele d/c'd. Wheelchair ordered for pt to be transported to [facility] lobby and d/c'd to home.

## 2014-08-20 NOTE — Progress Notes (Signed)
CARDIAC SURGERY CVSD/CTU PROGRESS NOTE    POD: 4    SURGERY: CABG x 3    EF: 45-50%    SURGEON: Ad, Niv, MD    ASSESSMENT AND PLAN:    NEURO:   Intact   OOB-chair.    CARDIAC:   SR- amio.   HTN- lopressor (low dose). stable   Pacing wires: removed   Core Measures: Beta blocker: Lopressor  Statin: Lipitor  ACE: no- lower BP  ASA: 325 mg    PULMONARY:   RA, good sats.   Chest tubes:  removed   Chest xray: stable on yesterday's film        GI:   Tolerating po.   Bowel protocol   + BM    RENAL:   Good UOP   Stop diuresis      HEME:   Last Hct- 34.7   ASA   DVT Prophylaxis:  SCD's.     ID:   Afebrile.    Cultures: n/a.     ENDO:    BG stable   NO DM history      Plan:   Ambulate   Lasix   Home today with his sister who is arriving from [place].       DISPOSITION:     Home at D/C, with his sister.         SUBJECTIVE:     Feels ready to go home.    OBJECTIVE:    VITALS LAST 24 HOURS:  Temp:  [97.8 F (36.6 C)-99.4 F (37.4 C)] 99.1 F (37.3 C)  Heart Rate:  [78-93] 93  Resp Rate:  [17-19] 18  BP: (96-121)/(62-73) 117/73 mmHg  O2 saturation: 95% on RA  Rhythm: SR    PHYSICAL EXAM:     Neuro: Awake, alert   Cardiac: RRR   Lungs: decreased at bases but overall clear   Abdomen: Soft, + BS   Extremities: Warm, no edema   Incisions: Intact/dressed. Sternum stable.    RECENT LABS:    CBC:  Recent Labs      08/19/14   0517  08/17/14   1459   WBC  13.44*  18.81*   HEMOGLOBIN  11.3*  11.1*   HEMATOCRIT  34.7*  34.3*       BMP:  Recent Labs      08/19/14   0517   SODIUM  138   POTASSIUM  4.2   CHLORIDE  105   CO2  28   BUN  18.0   CREATININE  0.8   GLUCOSE  91   MAGNESIUM  2.2   CALCIUM  8.3*       LFTs:  No results for input(s): AST, ALT, ALKPHOS, BILITOTAL, BILIDIRECT, BILIINDIRECT, PROT, ALB in the last 72 hours.    INR:  No results for input(s): PT, INR in the last 72 hours.      MEDICATIONS:    Current Facility-Administered Medications   Medication Dose Route Frequency   . amiodarone  200  mg Oral Q12H SCH   . aspirin  325 mg Oral Daily   . atorvastatin  10 mg Oral QPM   . famotidine  20 mg Oral Q12H SCH   . furosemide  20 mg Intravenous Daily   . metoprolol  12.5 mg Oral BID   . mupirocin   Nasal Q12H SCH   . polyethylene glycol  17 g Oral Daily   . potassium chloride  10 mEq Oral Daily   .  senna-docusate  1 tablet Oral QHS         INTAKE/OUTPUT:     Intake and Output Summary (Last 24 hours) at Date Time          Intake/Output Summary (Last 24 hours) at 08/20/14 0952  Last data filed at 08/20/14 0800   Gross per 24 hour   Intake   1260 ml   Output   2225 ml   Net   -965 ml

## 2014-08-20 NOTE — Discharge Summary (Signed)
FINAL                    [facility]                             Discharge  Summary     Name:          Jimmy Cardenas, Jimmy Cardenas  Facility:      [facility]  MRN:           [id number]               Admission  Date:  08/14/2014  Birth Date:    07/10/1966             Discharge  Date:  08/20/2014  Surgery Date:  08/16/2014     Attending Surgeon:   Deng Kemler,M.D.  Ref Cardiologist:    Truesdell,Alexander,M.D.     Procedure(s):  Coronary Artery  Bypass Grafting X   3      RIMA  to the OM1      LIMA  to the LAD      SVG to  the PDA           History of Present  Illness:    CARDIAC SURGERY  EVALUATION IS REQUESTED  FOR THIS 48  YEAR OLD MALE    WITH PROGRESSIVELY  WORSENING DYSPNEA  ON EXERTION AND  CHEST    DISCOMFORT. MYOCARDIAL  PERFUSION  STUDY DONE BY HIS  PCP SHOWED    INFERIOR WALL ISCHEMIAAND   MILDLY  REDUCED SYSTOLIC  FUNCTION AND HE    WAS SENT FOR CARDIOLOGY  EVALUATION.   CARDIAC CATH  WAS DONE ON    08/14/14 BY DR. Idolina Primer  AND REVEALED  90% DISTAL LEFT  MAIN DISEASE    AND SUBTOTAL MID  RCA OCCLUSION WITH  AND EF OF 45-  50%.  HE IS    REFERRED FOR CABG.     Surgery Indications     CAD           Discharge Diagnosis     CAD  Obesity  htn  hld  inferior wall ischemia  history of laminectomy  Past Medical History:    Hypertension       Hypercholesterolemia        CABG Procedure   VESSEL                     CONDUIT  RCA                       Vein graft  Left  Main                Free IMA  Left  Main                In Situ  LIMA        Postoperative Course:  The patient  tolerated   the procedure   and was  transferred   from the  operating  room to  the  ICU in  stable  condition  with  no  drips. The  patient  was  weaned  from  ventilator  support  and  extubated  without  difficulty.  The  patient  was  transferred   on  post  op  day  #  0 to  cardiovascular stepdown  in stable  condition.     While  on  SDU  the  patient  remained   hemodynamically   stable.  Chest  tubes and  pacing  wires  were  discontinued  without  incident.  At the  time  of  discharge   the  patient   was   tolerating  a   PO  diet  and  ambulating.     On POD 4 the  patient is stable  for discharge  with an HCT  of 34.7 and  a Creatinine  level  of  0.8.  Other  pertinent  lab  values are  listed  below.                 The patient is being  discharged home  on the following  medications:     MEDICATION           DOSE      UNITS        FREQUENCY  COMMENTS                                              bid for 2                                              days then  Amiodarone           200       mg                                              daily for                                              10 days  Aspirin              325       mg           Daily  Miralax              17        mg           Daily  senna                1         mg           nightly  Tramadol             50        mg           q6 prn  Lipitor              10        mg           Daily  Metoprolol           12.5      mg           BID  PATIENT INSTRUCTIONS  Activity:  Light Independent  & Advance as Tolerated     Diet:  Cardiac Diet           Special Instructions:  Follow-up in 1 weeks  with cardiac  surgery.           Patient education  was begun prior  to discharge in  accordance with  standards set by  the CVT service  and continued as  necessary through  discharge. This  education included  input from dietary  and cardiac  rehabilitation consultants.  A list  of instructions  was given to the  patient at the time  of discharge  which emphasized  permissible  activities, diet,  discharge medications  and emergency  contact  methods. A copy  of these instructions  is detailed  on the patient1s  hospital record.          Hermenia Bers, PA-C    electronically  signed on 08/20/2014  2:27:24 PM  with status of Final

## 2014-08-20 NOTE — Progress Notes (Signed)
Home Health Referral          Referral from Katharina Caper [phone] (Case Manager) for home health care upon discharge.    By [employer], the patient has the right to freely choose a home care provider.  Arrangements have been made with:     A company of the patients choosing. We have supplied the patient with a listing of providers in your area who asked to be included and participate in Medicare.   [facility], a home care agency that provides both adult home care services which is a wholly owned and operated by [employer] and participates in [employer]   The preferred provider of your insurance company. Choosing a home care provider other than your insurance company's preferred provider may affect your insurance coverage.    The Home Health Care Referral Form acknowledging the voluntary selection of the home care company has been completed, signed, and is on file.      Home Health Discharge Information     Your doctor has ordered Skilled Nursing in-home service(s) for you while you recuperate at home, to assist you in the transition from hospital to home.      The agency that you or your representative chose to provide the service:  Name of Home Health Agency: To be determined]     The above services were set up by:  Axzel Rockhill L. Romeo Apple  [facility] Liaison)   Phone         [phone]                                    Agency to be determined by insurance company.      Signed by: Zorita Pang. Romeo Apple  Date Time: 08/20/2014 12:22 PM

## 2014-08-20 NOTE — Discharge Instructions (Signed)
[facility]  [address]  [place], [place] [address]  Ph: [phone] Fax: [phone]    Directions from [facility]:  -Head North (left) onto [address]  -Go straight through the [address] interchange  -After the shopping center on right, make next right onto [address], follow approximately 0.2 miles, then take left onto [address]. The [facility]SA office is 0.2 miles on the right.    Discharge Instructions    Return Appointments:  -Surgeon's office visit [phone] (CABG 2 weeks, Valve 1 week with chest X-ray and a copy of chest X-ray). Call to make appointment when you get home.  -Cardiologist: call office for an appointment when you get home.  -Primary Care Physician (PCP): call office for an appointment when you get home.    Medications:  -Use medication schedule to determine when to take medications.  -Use the medication information sheets for details on your medications including side effects.  -Take only medications prescribed on Patient Discharge form.  -Bring a copy of the discharge order/new medication list with you to all physician/surgeon appointments; medications may be changed over time.    Exercise:  -Get up and get dressed each day.  -Climb stairs 1-2 times a day.  -Rest twice daily for 20 minutes.  -Walk four times per day, 7-8 minutes each. Gradually build up to 30 minutes once a day.  -Resume sexual activity when ready (when able to climb stairs without shortness of breath).  -Do not drive until directed by surgeon (generally after follow-up appointment).    Incision Care:  -Shower each day. Wash each incision with a fresh, clean, washcloth using mild soap and warm water (example: Dove Sensitive Skin body wash). Avoid vigorous scrubbing. Check incisions every day for signs of infection such as warm to touch, redness, purulent drainage or fever of 101 or greater.  -No tub baths until wounds are completely healed.  -Leave  incisions open to air. DO NOT apply any oils, creams, lotions, or powder to incisions, unless prescribed by your physician.            Self Care:  -Keep legs elevated above the level of your heart when resting.  -Use incentive spirometer every 1-2 hours while awake.  -Take temperature daily. Call if greater than 101.  -Take and record weight daily in the morning. Call if gain greater than 3 lbs in one day or greater than 5 lbs in 1 week.  -Sternal precautions: DO NOT lift more than 5-10 lbs and avoid any upper body exercise for 8-12 weeks.    Things to Expect:  -Loss of appetite is normal. Eat what you feel like until your appetite returns avoiding added salt. When able, resume prescribed low fat, low cholesterol, low salt diet.  -Chest tube sites may ooze.  -Difficulty sleeping, odd dreams, and getting comfortable in bed are common complaints. Taking pain medications before bed may help.  -Bruised and swollen leg incision. Hand swelling if radial artery used.  -Chest or incision pain/discomfort as well as back and shoulder pain.  -Feelings of depression or the "blues" are common.    Call Cardiac Surgery Office [phone] if you experience any of the following:    -Pulse less than 50 or greater than 120 beats per minute.  -Irregular or fluttering heart beat.  -Temperature greater than 101 degrees by mouth, chills, and or uncontrolled shaking.  -Chest pain (angina) other than incision pain.  -Incision site   that is very painful, swollen, warm to the touch, red, or persistently draining.  -Shortness of breath at rest.  -Weight gain of 3 pounds in 1 day or 5 pounds in 1 week.    Please refer to the HOME RECOVERY PLAN for weekly guidelines.    6 Daily Do's for ALL surgical patients:    1. Shower daily with above guidelines  2. Daily morning weight before breakfast  3. Incentive Spirometry per instructions  4. Pulse/Blood Pressure check  5. Temperature check daily and as needed  6. Ambulation/Exercise per Home Recovery  Plan      Discharge Instructions for Coronary Artery Bypass Graft Surgery (CABG)  Your doctor performed coronary artery bypass graft surgery (also called CABG, pronounced"cabbage"). This surgery created a new pathway around blocked parts of your heart'sblood vessels, allowing blood to reach your heart muscle.Your doctor used ahealthy blood vessel from another part of your body (a graft) to restore blood flow.  Activity   Discuss with your doctor what you can and can't do as you recover. You will have good and bad days. This is normal. But tell your doctor if you feel depressed, have trouble sleeping, or have a persistent decrease in appetite. Although these problems are common after surgery, they can slow your recovery. It's important to seek help.   Let others drive you to appointments for the first3-6weeks after your surgery.   Ask someone to stand nearby while you shower or do other activities, just in case you need help.   Avoid using very hot water while showering. It can affect your circulation and make you dizzy.   Weigh yourself every day, at the same time of day, and in the same kind of clothes. Quick weight gain can be a sign of a problem that needs your doctor's attention.   Start doing light work around the house and yard after2-3weeks at home. Don'tlift anything heavier than5pounds. Until approved by your doctor, avoid mowing the lawn, vacuuming, and doing other activities that could strain your breastbone.   Ask your doctor when you can expect to return to work.  Pain Relief  You will recover faster after surgery if your pain is kept under control.   Don't be surprised if you feel sharp pains as your breastbone heals or if you have soreness in your incision during changes in weather.   Tell your doctor if you have questions about what you're feeling, if your medications don't reduce your pain, or if you suddenly feel worse.  Incision Care  Healing takes several weeks. The bandage or  dressing on your chest will likely be removed before you go home. If itis still in place, ask your nurse how you should care for it after you return home.Do the following to care for your incision:   Clean your incision every day with soap and water.   Gently pat the area of the incision to dry it.   Don't use any powders, lotions, or oils on your incision until it is well healed.  Lifestyle Changes   Ask your doctor when you can start a walking program.   If you haven't already started a walking program in the hospital, begin with short walks (about5minutes) at home. Go a little longer each day.   Choose a safe place with a level surface, such as a local park or mall.   Wear supportive shoes to prevent injury to your knees and ankles.   Walk with someone. It's more fun and   it.   Take your medications exactly as directed. Don't skip doses.   Maintain a healthy weight. Get help to lose any extra pounds.   Cut back on salt.   Limit canned, dried, packaged, and fast foods.   Don't add salt to your food at the table.   Season foods with herbs instead of salt when you cook.   Break the smoking habit. Enroll in a stop-smoking program to improve your chances of success.  Follow-Up  Make a follow-up appointment as directed by our staff.  When to Call Your Doctor  Call your doctor immediately if you have any of the following:   Chest pain or a return of the heart symptoms you had before your surgery   Fever above100.9F   Signs of infection (redness, swelling, drainage, or warmth) at the incision site   Shortness of breath   Fainting   Weight gain of more than3pounds in24hours or more than5pounds in1week(s)   New or increasedswelling in your hands, feet, or ankles   Unrelieved pain at the incision site(s)   Changes in the location, type, or severity of pain   Fast or irregular pulse   Any unusual bleeding    2000-2011 [employer], [address]  [place], [place] [address]. All rights reserved. This information is not intended as a substitute for professional medical care. Always follow your healthcare professional's instructions.  AMI and CHF Heart Failure Core Measure Medication Checklist at Discharge:    Aspirin:    Prescribed  Statin:    Prescribed  Nitroglycerin:      Not Prescribed -  Not Applicable  Platelet Inhibitor:   Not Prescribed - Not Applicable  ACE-I:   Not Prescribed - Hypotension  ARB:     Not Prescribed - Other  B-Blocker:    Prescribed      Home Health Discharge Information     Your doctor has ordered Skilled Nursing in-home service(s) for you while you recuperate at home, to assist you in the transition from hospital to home.      The agency that you or your representative chose to provide the service:  Name of Home Health Agency: To be determined]     The above services were set up by:  Marisel Tostenson L. [facility]  [facility] Liaison)   Phone         [phone]

## 2014-08-20 NOTE — Plan of Care (Signed)
A&Ox4. VSS, SR/ST on tele with occasional PAC's. O2 sats 97% on RA. MS & LLE incisions CDI, OTA. Pt c/o incisional pain, managed with PRN Percocet. BMx2. Low fall risk. Pt frequently ambulating around unit independently, using IS. Continuing to monitor via hourly rounding, will report any changes.

## 2014-08-22 NOTE — Progress Notes (Signed)
Follow up phone conversation.    Spoke with: Patient; doing well  Pain:manageable  Bowel Movement: 12/9  Exercise:walking well. Walked outside today  Breathing/Incentive Spirometry: IS exercises being performed  SOB at rest: denies  Vital signs: good  Weight:staying the same.  Had first signs of appetite today  Incisions:cdi  Showering:yes  Visiting Nurse: [facility] saw patient yesterday.  Follow up Appointments made with Surgeon/PCP/Cardiology: Has appointments made.  Has a trip planned in 10 days. Recommended to patient to take the discharge paper work with him and to also get a copy of the discharge summary/operative report to travel with.  Also instructed pt to get up and walk around during flight.  No further questions or concerns.

## 2014-11-02 ENCOUNTER — Ambulatory Visit (INDEPENDENT_AMBULATORY_CARE_PROVIDER_SITE_OTHER): Payer: Self-pay

## 2014-11-15 ENCOUNTER — Ambulatory Visit (INDEPENDENT_AMBULATORY_CARE_PROVIDER_SITE_OTHER): Payer: Self-pay | Admitting: Cardiovascular Disease

## 2014-12-10 DIAGNOSIS — Z951 Presence of aortocoronary bypass graft: Secondary | ICD-10-CM | POA: Insufficient documentation

## 2014-12-11 ENCOUNTER — Inpatient Hospital Stay
Admission: RE | Admit: 2014-12-11 | Discharge: 2014-12-11 | Disposition: A | Discharge status: Home / Self Care | Payer: Commercial Managed Care - POS | Source: Ambulatory Visit | Attending: Cardiovascular Disease | Admitting: Cardiovascular Disease

## 2014-12-11 VITALS — BP 118/76 | Ht 66.0 in | Wt 179.7 lb

## 2014-12-11 DIAGNOSIS — Z951 Presence of aortocoronary bypass graft: Secondary | ICD-10-CM | POA: Insufficient documentation

## 2014-12-11 NOTE — Progress Notes (Signed)
Agree with the assessment and plan as noted above

## 2014-12-11 NOTE — Progress Notes (Signed)
Physical Assessment    BP  R:  118/72  L:  112/70  Standing:  Right: 122/78 Left: 118/76    Apical Pulse:  regular    Pulses  Left Side Right Side   PT:  2+  PT:  2+   Radial:  2+   Radial:  2+   Carotid:  2+ Carotid:  2+     Carotid Bruit:  0   Carotid bruit:  0     Heart Sounds:  S1, S2     Lung Sounds:  cta equal and bilaterally     Muscle Skeletal:  strong    Incisions:  Sternum, healed    Peripheral Edema:  none    Diabetic Foot Check:  N/a    Comments on physical/behavior/body movements/speech needs:  Pt is a 49 year old hispanic male pleasant and cooperative. Skin pink and warm to touch.  Steady gait observed, ambulating without difficulty.

## 2014-12-13 ENCOUNTER — Ambulatory Visit: Payer: Commercial Managed Care - POS

## 2014-12-17 ENCOUNTER — Ambulatory Visit: Payer: Commercial Managed Care - POS

## 2014-12-19 ENCOUNTER — Ambulatory Visit: Payer: Commercial Managed Care - POS

## 2014-12-24 ENCOUNTER — Ambulatory Visit: Payer: Commercial Managed Care - POS

## 2014-12-26 ENCOUNTER — Ambulatory Visit: Payer: Commercial Managed Care - POS

## 2014-12-27 NOTE — Progress Notes (Signed)
Agree with the assessment and plan as noted above

## 2014-12-31 ENCOUNTER — Ambulatory Visit: Payer: Commercial Managed Care - POS

## 2015-01-02 ENCOUNTER — Ambulatory Visit: Payer: Commercial Managed Care - POS

## 2015-01-07 ENCOUNTER — Ambulatory Visit: Payer: Commercial Managed Care - POS

## 2015-01-09 ENCOUNTER — Ambulatory Visit: Payer: Commercial Managed Care - POS

## 2015-01-14 ENCOUNTER — Ambulatory Visit: Payer: Commercial Managed Care - POS

## 2015-01-16 ENCOUNTER — Ambulatory Visit: Payer: Commercial Managed Care - POS

## 2015-01-21 ENCOUNTER — Ambulatory Visit: Payer: Commercial Managed Care - POS

## 2015-01-23 ENCOUNTER — Ambulatory Visit: Payer: Commercial Managed Care - POS

## 2020-12-17 NOTE — Progress Notes (Signed)
[facility] OFFICE  [address] [place], [place] [address]     Candie Mile    Date of Visit:  11/15/2014  Date of Birth: 02-Dec-1965  Age: 55 yrs.   Medical Record Number: [id number]  Referring Physician: MEHRA DO, RAJESH N.  __   CURRENT DIAGNOSES     1. Hyperlipidemia, mixed, E78.2  2. Hypertension (essential or benign or malignant), I10  3. CAD without angina, I25.10  4. Cardiomyopathy, Unspecified, I42.9   5. 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 Angina pectoris unspecified with CAD, I25.119  __  ALLERGIES     No Known Allergies  __  MEDICATIONS     1. Lipitor 40 mg tablet, 1 po qd  2. Aspirin Low  Dose 81 mg tablet,delayed release, once daily  3. metoprolol tartrate 25 mg tablet, 1/2 tablet twice daily  __  CHIEF COMPLAINT/REASON FOR VISIT   Followup of Coronary artery disease.  __  HISTORY OF PRESENT ILLNESS    Mr. Interrante returns for follow-up of his multivessel coronary artery  disease. He has done very well after bypass surgery. He has been walking for 30+ minutes for exercise intermittently without difficulty or cardiopulmonary symptoms. He has not been fully compliant with his medical therapy. He has no acute complaints today.  He is scheduled to start Cardiac Rehab later this month.    __  PAST HISTORY     Past  Medical Illnesses: HLD, HTN;  Past Cardiac Illnesses: CABG, CAD;  Infectious Diseases: No previous history of significant infectious diseases.; Surgical Procedures:  No previous surgical procedures., CXR 08/19/14, CABG x 3 08/14/14; Trauma History: No previous history of significant trauma.;  Cardiology Procedures-Invasive: Cath 08/14/14; Cardiology Procedures-Noninvasive: MPI (Nuclear) Study  November 2015, Echo 08/14/14; Cardiac Cath Results: EF 45 % , inferior wall severe hypokinesis,anterior wall mild hypokinesis, LAD 90 % stenosis;  Left Ventricular Ejection Fraction: LVEF of 44% documented via nuclear study on 07/23/2014; Peripheral Vascular Procedures : LEVD 08/14/14, Carotid  08/14/14    __  CARDIAC RISK FACTORS     Tobacco Abuse : has never used tobacco; Family History of Heart Disease: negative; Hyperlipidemia : positive; Hypertension: positive;  Diabetes Mellitus : negative; Prior History of Heart Disease: positive; Obesity : positive; Sedentary Life Style:negative; WGN:[id number];  Menopausal:not applicable  __  SOCIAL HISTORY     Alcohol Use: Does not use alcohol; Smoking: never smoked; Never smoker ([id number]);  Diet: Regular diet and Caffeine use-1-2 per day; Exercise: cardio 3x a week;   __   REVIEW OF SYSTEMS    General: Denies recent weight loss, weight gain, fever or chills or change  in exercise tolerance.; Integumentary: Denies any change in hair or nails, rashes, or skin lesions.;  Eyes: Denies diplopia, history of glaucoma or visual field defects.; Ears, Nose, Throat, Mouth: Denies  any hearing loss, epistaxis, hoarseness or difficulty speaking.;Respiratory: Denies dyspnea, snoring, cough, wheezing or hemoptysis.;  Cardiovascular: Please review HPI, chest discomfort; Abdominal : Denies ulcer disease, hematochezia  or melena.;Musculoskeletal:Denies any history of venous insufficiency, arthritic symptoms or back problems.;  Neurological : Denies any history of recurrent strokes, TIA, or seizure disorder.; Psychiatric: Denies  any history of depression, substance abuse or change in cognitive functions.; Endocrine: Denies any history of weight change, heat/cold intolerance,  polydipsia, or polyuria; Hematologic/Immunologic: Denies any food allergies, seasonal allergies, bleeding disorders.  __   PHYSICAL EXAMINATION    Vital Signs:  Blood Pressure:  132/80 Sitting, Left arm, regular  cuff  134/80 Sitting,  Right arm, regular cuff    Weight: 175.00 lbs.  Height:  66"  BMI: 28   Pulse: 80/min.    Respirations: 18/min.       Constitutional: Cooperative, alert and oriented,well developed,  well nourished, in no acute distress. Skin: Warm and dry to touch, no apparent skin lesions, or  masses noted.  Head: Normocephalic, normal hair pattern, no masses or tenderness Eyes: EOMS Intact, PERRL, conjunctivae  and lids unremarkable. Funduscopic exam and visual fields not performed. ENT: Ears, Nose and throat reveal no gross abnormalities. No pallor or cyanosis.  Dentition good. Neck: No palpable masses or adenopathy, no thyromegaly, no JVD, carotid pulses are full and equal bilaterally without bruits.  Chest: Normal symmetry, no tenderness to palpation, normal respiratory excursion, no intercostal retraction, no use of accessory muscles, normal diaphragmatic excursion, clear to auscultation and percussion.  Cardiac: Regular rhythm, S1 normal, S2 normal, No S3 or S4, Apical impulse not displaced, no murmurs, gallops or rubs detected. Abdomen : Abdomen soft, bowel sounds normoactive, no masses, no hepatosplenomegaly, non-tender, no bruits Peripheral Pulses: The femoral, popliteal, dorsalis  pedis, and posterior tibial pulses are full and equal bilaterally with no bruits auscultated. Extremities/Back: No deformities, clubbing, cyanosis, erythema  or edema observed. There are no spinal abnormalities noted. Normal muscle strength and tone. Neurological: No gross motor or sensory deficits noted,  affect appropriate, oriented to time, person and place.   __    Medications added today by the physician:  Aspirin Low Dose 81 mg tablet,delayed  release, once daily, 30  metoprolol tartrate 25 mg tablet, 1/2 tablet twice daily, 30    IMPRESSIONS:    1. Multivessel coronary artery disease.  - CABG 08-2014: LIMA-LAD, RIMA-OM, SVG-RPDA.  - Cath 08-2014: LM distal 90%. LAD/LCX  mild disease. RCA mid 90%.  2. EF 50% by Echo [id number]. Mild inferior/anerior wall hypokinesis, no significant valve disease.  3. Hyperlipidemia. On Statin.  4. Carotid Duplex 08-2014: bilateral ICA  50%.    RECOMMENDATIONS:     1. Restart Aspirin 81mg  once daily (patient stopped of his own accord).  2. Continue Statin.  3. Restart Metoprolol as  prescribed (patient altered dosing of his own accord).  4. [place] Stress complete. Enrolled in Cardiac Rehab. Start daily  walking program pending start of Rehab this month.  5. Referred to Nutritionist for dietary consultation.  6. Follow-up with me in 3 months.    cc: Rosario Jacks MEHRA DO       ____________________________  Christianne Dolin  EA:[id number] Education ICD-10: Z95.1 MedlinePlus Connect results for ICD-10 Z95.1  Return Visit 15 MIN 3 months         Gillermo Poch G. Idolina Primer, MD

## 2023-09-16 IMAGING — MR MRI LUMBAR SPINE WITHOUT CONTRAST
7 series · 48 of 48 positions shown · non-contrast
Comparison: none

﻿

MAGNETIC RESONANCE IMAGING LUMBAR SPINE WITHOUT INTRAVENOUS CONTRAST ADMINISTRATION
HISTORY: Chronic/persistent low back pain.  History of multiple prior surgeries.
TECHNIQUE: Magnetic resonance imaging of the lumbar spine was accomplished employing a surface coil and an open 0.3Epi Hashi scanner without intravenous contrast administration.  T1 and T2-weighted thin slice sections were obtained.

[Series 1: s-c scano · coronal · 6.0mm · 1.17mm/px · 2 of 8 slices shown (1 of 2)]
[im 1/8]
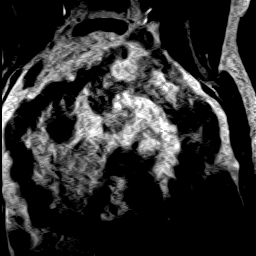
[im 8/8]
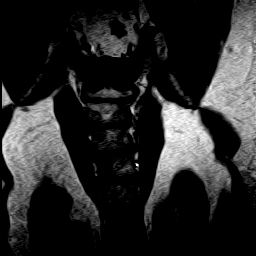

[Series 2: s-c scano · coronal · 6.0mm · 1.17mm/px · 5 of 11 slices shown (2 of 2)]
[im 1/11]
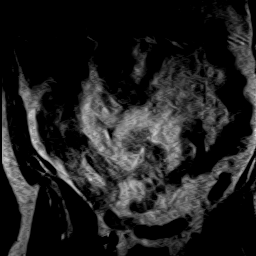
[im 3/11]
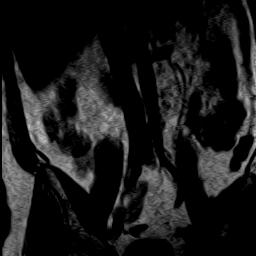
[im 6/11]
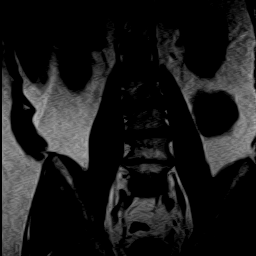
[im 8/11]
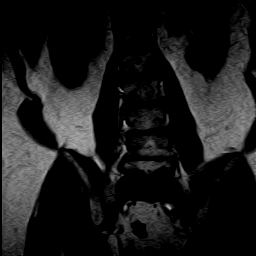
[im 11/11]
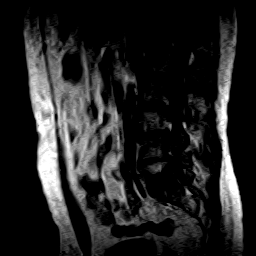

[Series 3: T2 · sagittal · 5.0mm · 1.13mm/px · 6 of 13 slices shown (1 of 2)]
[im 1/13]
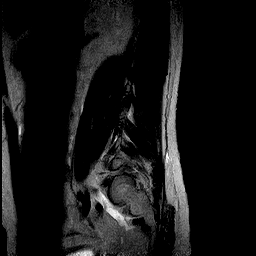
[im 3/13]
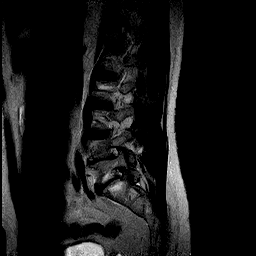
[im 5/13]
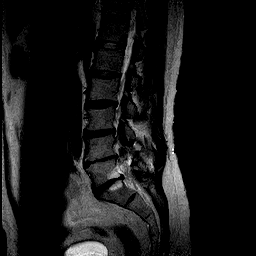
[im 8/13]
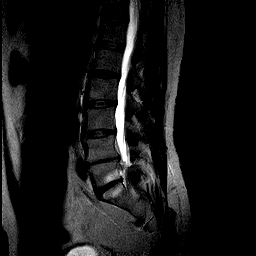
[im 10/13]
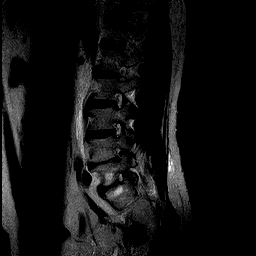
[im 13/13]
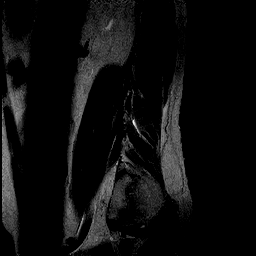

[Series 4: T1 · sagittal · 5.0mm · 1.13mm/px · 6 of 13 slices shown (1 of 2)]
[im 1/13]
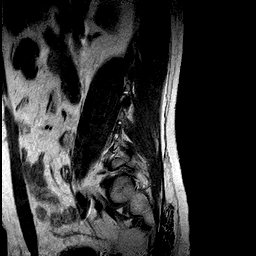
[im 3/13]
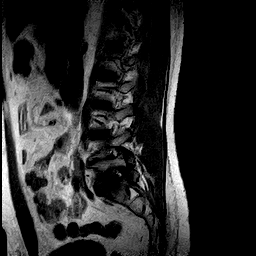
[im 5/13]
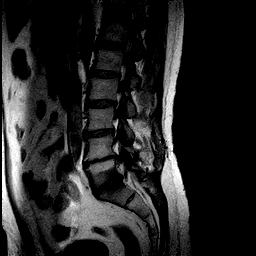
[im 8/13]
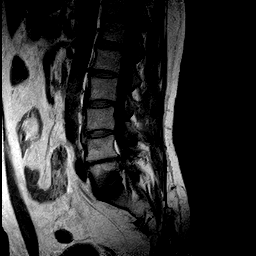
[im 10/13]
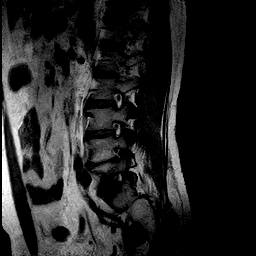
[im 13/13]
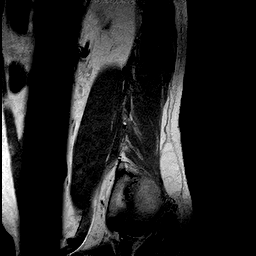

[Series 5: sag fir · sagittal · 5.0mm · 1.13mm/px · 5 of 11 slices shown]
[im 1/11]
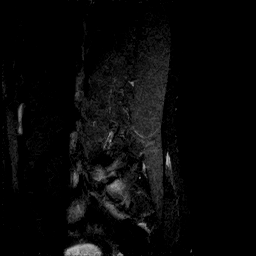
[im 3/11]
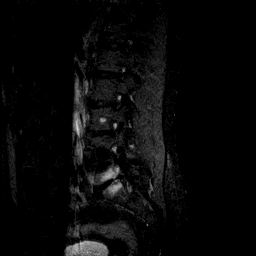
[im 6/11]
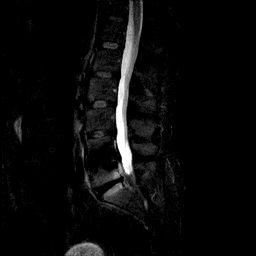
[im 8/11]
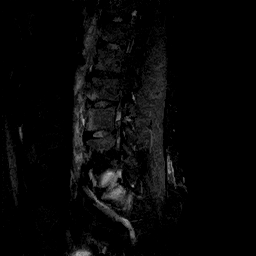
[im 11/11]
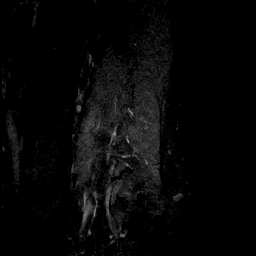

[Series 6: T2 · axial · 4.0mm · 1.02mm/px · z∈[-102,+59]mm · 12 of 28 slices shown (2 of 2)]
[im 1/28]
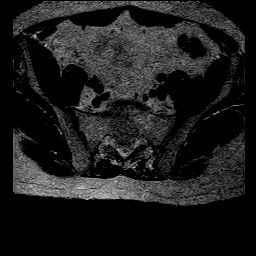
[im 3/28]
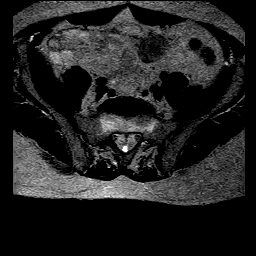
[im 5/28]
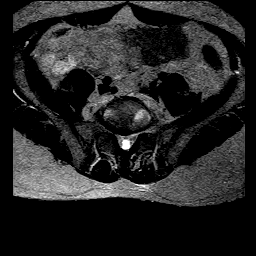
[im 8/28]
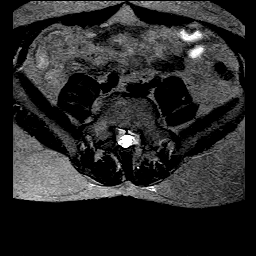
[im 10/28]
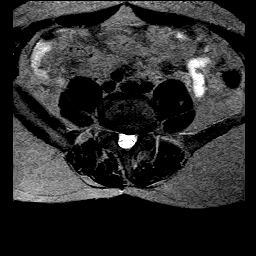
[im 13/28]
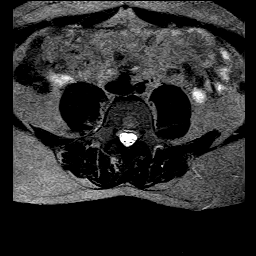
[im 15/28]
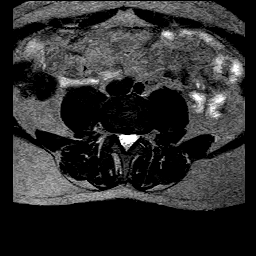
[im 18/28]
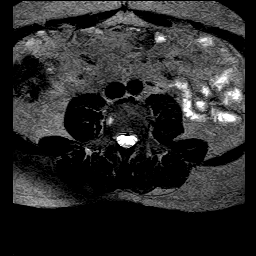
[im 20/28]
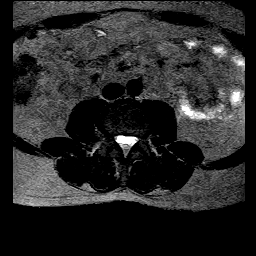
[im 23/28]
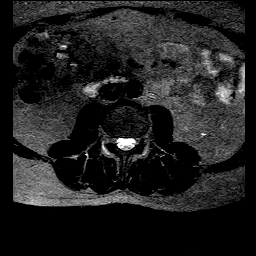
[im 25/28]
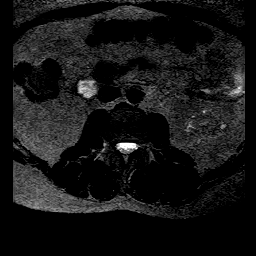
[im 28/28]
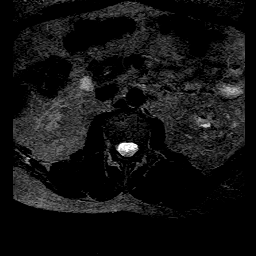

[Series 7: T1 · axial · 4.0mm · 1.02mm/px · z∈[-102,+59]mm · 12 of 28 slices shown (2 of 2)]
[im 1/28]
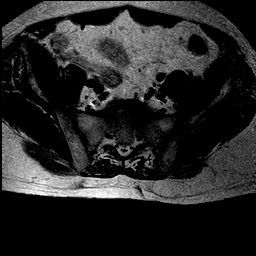
[im 3/28]
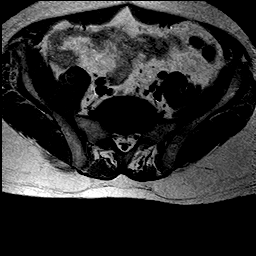
[im 5/28]
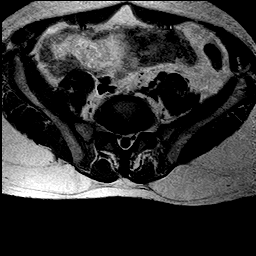
[im 8/28]
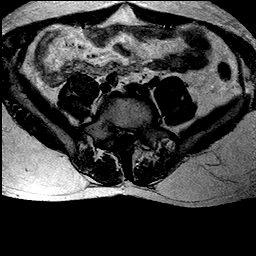
[im 10/28]
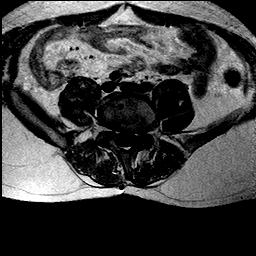
[im 13/28]
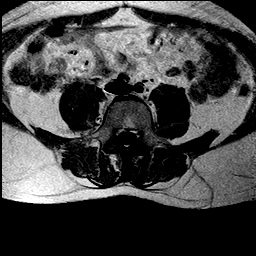
[im 15/28]
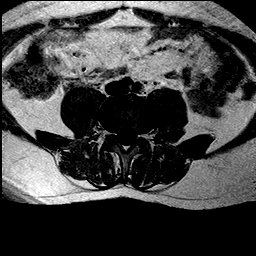
[im 18/28]
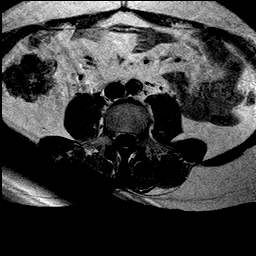
[im 20/28]
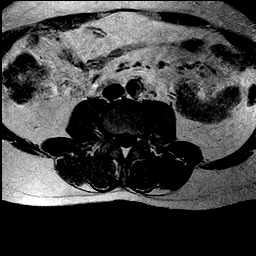
[im 23/28]
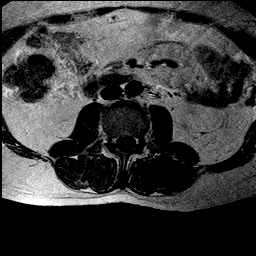
[im 25/28]
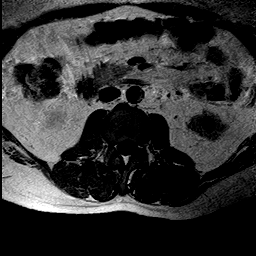
[im 28/28]
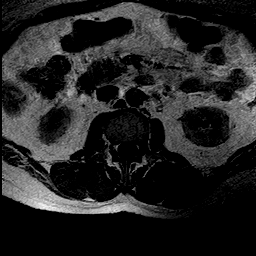

[48 of 48 positions shown; findings below may reference images not displayed]

FINDINGS: No prior studies are available for comparison purposes.  

A mild to moderate rotatory lumbar levoscoliosis is present.  The lumbar vertebrae appear normal in height.  Reactive marrow endplate signal changes about L4-L5 interspace.  Prominent bone marrow signal abnormality laterally bilaterally, about L5-S1 interspace, suggesting reactive marrow edema.  Tiny (6 mm) nodular focus of mottled bone marrow signal abnormality L3 vertebra suspicious for vascular change or small hemangioma.  No additional significant abnormalities of regional bone marrow signal observed.  Postoperative changes dorsally at the L4-L5 interspace where left hemilaminectomy defect suggested.  

The conus appears normal.  No focal abnormalities of included paraspinal soft tissues observed.  

Moderate lumbar degenerative disc disease diffusely with disc desiccation and disc space narrowing.  Mild to moderate spondylotic change with extradural spurring and facet joint and ligamentous overgrowth.

Early spondylosis present L1-L2 and L2-L3 interspaces with small ventral disc and spur complexes.  No spinal canal or neural foraminal stenosis.  

Moderate spondylosis L3-L4 interspace with small to moderate sized broad-based ventral disc and spur complex and broad-based degenerative disc bulging.  Again, no evidence for spinal canal or neural foraminal stenosis. 

Examination of the postoperative L4-L5 interspace demonstrates moderate spondylosis with ventral disc and spur complex and facet joint and ligamentous overgrowth dorsally.  Patchy increased T1 signal intensity about ventral thecal sac and proximal L5 nerve roots suspicious for early epidural scarring/fibrosis.  No residual significant spinal canal stenosis.  Spondylosis is associated with moderate bilateral L4 neural foraminal stenoses.  

Examination of the L5-S1 interspace demonstrates spondylosis with moderate sized broad-based ventral disc and spur complex and broad-based degenerative disc bulging.  No spinal canal stenosis.  Moderately severe bilateral L5 neural foraminal stenoses.
IMPRESSION: 1. Moderate lumbar spondylosis and degenerative disc disease diffusely as described. 

2. Postoperative changes L4-L5 interspace where early epidural fibrosis is suspected.  Moderate bilateral L4 neural foraminal stenoses secondary to spondylosis. 

3. Spondylosis L5-S1 interspace with bilateral L5 neural foraminal stenoses as discussed.  There is bone marrow signal abnormality about this interspace suggesting reactive marrow edema.  

4. Please see in-depth discussion by level in body of report.
